# Patient Record
Sex: Male | Born: 1950 | Race: White | Hispanic: No | Smoking: Never smoker
Health system: Southern US, Community
[De-identification: ages and names within clinical notes are randomized; demographics above are authoritative.]

## PROBLEM LIST (undated history)

## (undated) DIAGNOSIS — R112 Nausea with vomiting, unspecified: Secondary | ICD-10-CM

## (undated) DIAGNOSIS — I10 Essential (primary) hypertension: Secondary | ICD-10-CM

## (undated) DIAGNOSIS — K219 Gastro-esophageal reflux disease without esophagitis: Secondary | ICD-10-CM

## (undated) DIAGNOSIS — E7800 Pure hypercholesterolemia, unspecified: Secondary | ICD-10-CM

## (undated) DIAGNOSIS — Z9889 Other specified postprocedural states: Secondary | ICD-10-CM

## (undated) HISTORY — PX: APPENDECTOMY: SHX54

## (undated) HISTORY — PX: COLON SURGERY: SHX602

## (undated) HISTORY — PX: CHOLECYSTECTOMY: SHX55

## (undated) HISTORY — PX: BACK SURGERY: SHX140

## (undated) HISTORY — PX: NECK SURGERY: SHX720

## (undated) HISTORY — DX: Essential (primary) hypertension: I10

## (undated) HISTORY — PX: SPINE SURGERY: SHX786

## (undated) HISTORY — DX: Pure hypercholesterolemia, unspecified: E78.00

## (undated) HISTORY — PX: OTHER SURGICAL HISTORY: SHX169

## (undated) HISTORY — DX: Gastro-esophageal reflux disease without esophagitis: K21.9

---

## 2015-06-21 ENCOUNTER — Other Ambulatory Visit: Payer: Self-pay | Admitting: Neurosurgery

## 2015-06-21 DIAGNOSIS — M5416 Radiculopathy, lumbar region: Secondary | ICD-10-CM

## 2015-07-06 ENCOUNTER — Ambulatory Visit
Admission: RE | Admit: 2015-07-06 | Discharge: 2015-07-06 | Disposition: A | Discharge status: Home / Self Care | Payer: 59 | Source: Ambulatory Visit | Attending: Neurosurgery | Admitting: Neurosurgery

## 2015-07-06 DIAGNOSIS — M5416 Radiculopathy, lumbar region: Secondary | ICD-10-CM

## 2015-07-06 MED ORDER — GADOBENATE DIMEGLUMINE 529 MG/ML IV SOLN
20.0000 mL | Freq: Once | INTRAVENOUS | Status: AC | PRN
  Administered 2015-07-06: 20 mL via INTRAVENOUS

## 2015-07-15 ENCOUNTER — Other Ambulatory Visit: Payer: Self-pay | Admitting: Neurosurgery

## 2015-07-15 ENCOUNTER — Other Ambulatory Visit (HOSPITAL_COMMUNITY): Payer: Self-pay | Admitting: Neurosurgery

## 2015-07-15 DIAGNOSIS — M5416 Radiculopathy, lumbar region: Secondary | ICD-10-CM

## 2015-07-18 ENCOUNTER — Encounter (HOSPITAL_COMMUNITY): Payer: Self-pay

## 2015-07-18 ENCOUNTER — Ambulatory Visit (HOSPITAL_COMMUNITY)
Admission: RE | Admit: 2015-07-18 | Discharge: 2015-07-18 | Disposition: A | Discharge status: Home / Self Care | Payer: 59 | Source: Ambulatory Visit | Attending: Neurosurgery | Admitting: Neurosurgery

## 2015-07-18 DIAGNOSIS — Z981 Arthrodesis status: Secondary | ICD-10-CM | POA: Insufficient documentation

## 2015-07-18 DIAGNOSIS — M4806 Spinal stenosis, lumbar region: Secondary | ICD-10-CM | POA: Diagnosis not present

## 2015-07-18 DIAGNOSIS — M4726 Other spondylosis with radiculopathy, lumbar region: Secondary | ICD-10-CM | POA: Insufficient documentation

## 2015-07-18 DIAGNOSIS — M5416 Radiculopathy, lumbar region: Secondary | ICD-10-CM

## 2015-07-18 MED ORDER — ONDANSETRON HCL 4 MG/2ML IJ SOLN: 4.0000 mg | Freq: Four times a day (QID) | INTRAMUSCULAR | Status: DC | PRN

## 2015-07-18 MED ORDER — DIAZEPAM 5 MG PO TABS
ORAL_TABLET | ORAL | Status: AC
  Filled 2015-07-18: qty 2

## 2015-07-18 MED ORDER — LIDOCAINE HCL (PF) 1 % IJ SOLN
INTRAMUSCULAR | Status: AC
  Filled 2015-07-18: qty 5

## 2015-07-18 MED ORDER — DIAZEPAM 5 MG PO TABS
10.0000 mg | ORAL_TABLET | Freq: Once | ORAL | Status: AC
Start: 2015-07-18 — End: 2015-07-18
  Administered 2015-07-18: 10 mg via ORAL
  Filled 2015-07-18: qty 2

## 2015-07-18 MED ORDER — IOHEXOL 180 MG/ML  SOLN
20.0000 mL | Freq: Once | INTRAMUSCULAR | Status: AC | PRN
  Administered 2015-07-18: 16 mL via INTRATHECAL

## 2015-07-18 NOTE — Procedures (Signed)
LP L 23 level with good return of CSF.  15 cc Omnipaque 180 instilled with good opacification of thecal sac.

## 2015-07-18 NOTE — Discharge Instructions (Signed)

## 2015-11-28 DIAGNOSIS — K219 Gastro-esophageal reflux disease without esophagitis: Secondary | ICD-10-CM | POA: Insufficient documentation

## 2015-11-28 DIAGNOSIS — G43909 Migraine, unspecified, not intractable, without status migrainosus: Secondary | ICD-10-CM | POA: Insufficient documentation

## 2015-11-28 DIAGNOSIS — G4730 Sleep apnea, unspecified: Secondary | ICD-10-CM | POA: Insufficient documentation

## 2015-11-28 DIAGNOSIS — G8929 Other chronic pain: Secondary | ICD-10-CM | POA: Insufficient documentation

## 2015-11-28 DIAGNOSIS — K5790 Diverticulosis of intestine, part unspecified, without perforation or abscess without bleeding: Secondary | ICD-10-CM | POA: Insufficient documentation

## 2016-01-03 ENCOUNTER — Encounter (INDEPENDENT_AMBULATORY_CARE_PROVIDER_SITE_OTHER): Payer: Self-pay | Admitting: *Deleted

## 2016-01-15 ENCOUNTER — Encounter (INDEPENDENT_AMBULATORY_CARE_PROVIDER_SITE_OTHER): Payer: Self-pay

## 2016-01-15 ENCOUNTER — Encounter (INDEPENDENT_AMBULATORY_CARE_PROVIDER_SITE_OTHER): Payer: Self-pay | Admitting: *Deleted

## 2016-01-15 ENCOUNTER — Ambulatory Visit (INDEPENDENT_AMBULATORY_CARE_PROVIDER_SITE_OTHER): Payer: 59 | Admitting: Internal Medicine

## 2016-01-15 ENCOUNTER — Other Ambulatory Visit (INDEPENDENT_AMBULATORY_CARE_PROVIDER_SITE_OTHER): Payer: Self-pay | Admitting: *Deleted

## 2016-01-15 ENCOUNTER — Other Ambulatory Visit (INDEPENDENT_AMBULATORY_CARE_PROVIDER_SITE_OTHER): Payer: Self-pay | Admitting: Internal Medicine

## 2016-01-15 ENCOUNTER — Encounter (INDEPENDENT_AMBULATORY_CARE_PROVIDER_SITE_OTHER): Payer: Self-pay | Admitting: Internal Medicine

## 2016-01-15 DIAGNOSIS — R195 Other fecal abnormalities: Secondary | ICD-10-CM

## 2016-01-15 DIAGNOSIS — Z800 Family history of malignant neoplasm of digestive organs: Secondary | ICD-10-CM

## 2016-01-15 DIAGNOSIS — R1032 Left lower quadrant pain: Secondary | ICD-10-CM

## 2016-01-15 DIAGNOSIS — I10 Essential (primary) hypertension: Secondary | ICD-10-CM | POA: Insufficient documentation

## 2016-01-15 DIAGNOSIS — E7800 Pure hypercholesterolemia, unspecified: Secondary | ICD-10-CM | POA: Insufficient documentation

## 2016-01-15 LAB — CBC WITH DIFFERENTIAL/PLATELET
BASOS ABS: 0 {cells}/uL (ref 0–200)
Basophils Relative: 0 %
EOS PCT: 3 %
Eosinophils Absolute: 204 cells/uL (ref 15–500)
HCT: 47.8 % (ref 38.5–50.0)
HEMOGLOBIN: 16.1 g/dL (ref 13.2–17.1)
LYMPHS ABS: 1836 {cells}/uL (ref 850–3900)
LYMPHS PCT: 27 %
MCH: 29.2 pg (ref 27.0–33.0)
MCHC: 33.7 g/dL (ref 32.0–36.0)
MCV: 86.6 fL (ref 80.0–100.0)
MONOS PCT: 9 %
MPV: 9.7 fL (ref 7.5–12.5)
Monocytes Absolute: 612 cells/uL (ref 200–950)
NEUTROS PCT: 61 %
Neutro Abs: 4148 cells/uL (ref 1500–7800)
Platelets: 216 10*3/uL (ref 140–400)
RBC: 5.52 MIL/uL (ref 4.20–5.80)
RDW: 16.9 % — AB (ref 11.0–15.0)
WBC: 6.8 10*3/uL (ref 3.8–10.8)

## 2016-01-15 MED ORDER — ONDANSETRON HCL 4 MG PO TABS: 4.0000 mg | ORAL_TABLET | Freq: Three times a day (TID) | ORAL | Status: DC | PRN

## 2016-01-15 MED ORDER — PEG 3350-KCL-NA BICARB-NACL 420 G PO SOLR: 4000.0000 mL | Freq: Once | ORAL | Status: DC

## 2016-01-15 NOTE — Telephone Encounter (Signed)
Patient needs trilyte 

## 2016-01-15 NOTE — Patient Instructions (Signed)
The risks and benefits such as perforation, bleeding, and infection were reviewed with the patient and is agreeable. 

## 2016-01-15 NOTE — Progress Notes (Addendum)
   Subjective:    Patient ID: Alexander DingwallKeith Harwick, male    DOB: 04/26/1951, 65 y.o.   MRN: [id number]  HPI Referred by Dr. Jonelle SportsPomposini for  Left abdominal pain.   He says he is alternating between constipation and diarrhea. He usually has 1-2 stools a day. CT showed borderline infection per patient. He says he was placed on Cipro and Flagyl. Left lower quadrant pain which started 3 weeks ago and radiates around to his back.  He says the pain increases as the day goes by once he starts moving around. He also says he has lost 12 pounds in the past month.   Appetite is good.  No fever.  Family hx of colon cancer in father who was 3661.  His last colonoscopy was February of 2014 before he had a sigmoidectomy due to diverticulitis/perforation.  Patient would like to proceed with a colonoscopy.    Colonoscopy 2014 Dr. Kirby CriglerSheflett:  Some narrowing of the sigmoid colon probably from recent diverticulitis, but no actual stricture and no inflammatory process. No neoplasia, Normal anal mucosa.   Review of Systems Past Medical History  Diagnosis Date  . Hypertension   . High cholesterol     Past Surgical History  Procedure Laterality Date  . Back surgery    . Neck surgery    . Cholecystectomy      abdominal pain  . Sigmoid colon surgery      in 2014    No Known Allergies  Current Outpatient Prescriptions on File Prior to Visit  Medication Sig Dispense Refill  . aspirin 81 MG tablet Take 81 mg by mouth daily.    Marland Kitchen. labetalol (NORMODYNE) 200 MG tablet Take 400 mg by mouth 2 (two) times daily.    . rosuvastatin (CRESTOR) 5 MG tablet Take 2.5 mg by mouth every other day.    . Vitamin D, Cholecalciferol, 1000 UNITS TABS Take 1,000 Units by mouth daily.     No current facility-administered medications on file prior to visit.        Objective:   Physical Exam Blood pressure 130/90, pulse 64, temperature 97.6 F (36.4 C), height 5\' 11"  (1.803 m), weight 213 lb 8 oz (96.843 kg). Alert and oriented.  Skin warm and dry. Oral mucosa is moist.   . Sclera anicteric, conjunctivae is pink. Thyroid not enlarged. No cervical lymphadenopathy. Lungs clear. Heart regular rate and rhythm.  Abdomen is soft. Bowel sounds are positive. No hepatomegaly. No abdominal masses felt. Slight tenderness left lower abdomen on deep palpitation.   No edema to lower extremities.       Assessment & Plan:  Left lower abdominal pain? Etiology. Doubt this is diverticulitis. Will get a CBC, Urinalysis.  Colonoscopy. The risks and benefits such as perforation, bleeding, and infection were reviewed with the patient and is agreeable.

## 2016-01-16 LAB — URINALYSIS
BILIRUBIN URINE: NEGATIVE
Glucose, UA: NEGATIVE
Hgb urine dipstick: NEGATIVE
KETONES UR: NEGATIVE
Leukocytes, UA: NEGATIVE
Nitrite: NEGATIVE
Protein, ur: NEGATIVE
SPECIFIC GRAVITY, URINE: 1.018 (ref 1.001–1.035)
pH: 5.5 (ref 5.0–8.0)

## 2016-01-24 ENCOUNTER — Encounter (HOSPITAL_COMMUNITY)
Admission: RE | Disposition: A | Discharge status: Home / Self Care | Payer: Self-pay | Source: Ambulatory Visit | Attending: Internal Medicine

## 2016-01-24 ENCOUNTER — Ambulatory Visit (HOSPITAL_COMMUNITY)
Admission: RE | Admit: 2016-01-24 | Discharge: 2016-01-24 | Disposition: A | Discharge status: Home / Self Care | Payer: 59 | Source: Ambulatory Visit | Attending: Internal Medicine | Admitting: Internal Medicine

## 2016-01-24 ENCOUNTER — Encounter (HOSPITAL_COMMUNITY): Payer: Self-pay | Admitting: *Deleted

## 2016-01-24 DIAGNOSIS — I10 Essential (primary) hypertension: Secondary | ICD-10-CM | POA: Diagnosis not present

## 2016-01-24 DIAGNOSIS — R1032 Left lower quadrant pain: Secondary | ICD-10-CM | POA: Diagnosis not present

## 2016-01-24 DIAGNOSIS — Z800 Family history of malignant neoplasm of digestive organs: Secondary | ICD-10-CM | POA: Insufficient documentation

## 2016-01-24 DIAGNOSIS — E7800 Pure hypercholesterolemia, unspecified: Secondary | ICD-10-CM | POA: Insufficient documentation

## 2016-01-24 DIAGNOSIS — Z79899 Other long term (current) drug therapy: Secondary | ICD-10-CM | POA: Insufficient documentation

## 2016-01-24 DIAGNOSIS — K5730 Diverticulosis of large intestine without perforation or abscess without bleeding: Secondary | ICD-10-CM | POA: Insufficient documentation

## 2016-01-24 DIAGNOSIS — K648 Other hemorrhoids: Secondary | ICD-10-CM | POA: Diagnosis not present

## 2016-01-24 DIAGNOSIS — R194 Change in bowel habit: Secondary | ICD-10-CM

## 2016-01-24 DIAGNOSIS — Z980 Intestinal bypass and anastomosis status: Secondary | ICD-10-CM | POA: Diagnosis not present

## 2016-01-24 DIAGNOSIS — Z7982 Long term (current) use of aspirin: Secondary | ICD-10-CM | POA: Diagnosis not present

## 2016-01-24 DIAGNOSIS — R195 Other fecal abnormalities: Secondary | ICD-10-CM

## 2016-01-24 HISTORY — PX: COLONOSCOPY: SHX5424

## 2016-01-24 SURGERY — COLONOSCOPY
Anesthesia: Moderate Sedation

## 2016-01-24 MED ORDER — MEPERIDINE HCL 50 MG/ML IJ SOLN
INTRAMUSCULAR | Status: DC | PRN
  Administered 2016-01-24 (×2): 25 mg via INTRAVENOUS

## 2016-01-24 MED ORDER — MIDAZOLAM HCL 5 MG/5ML IJ SOLN
INTRAMUSCULAR | Status: DC | PRN
  Administered 2016-01-24 (×2): 2 mg via INTRAVENOUS
  Administered 2016-01-24: 1 mg via INTRAVENOUS
  Administered 2016-01-24: 2 mg via INTRAVENOUS
  Administered 2016-01-24: 3 mg via INTRAVENOUS

## 2016-01-24 MED ORDER — SIMETHICONE 40 MG/0.6ML PO SUSP
ORAL | Status: AC
  Filled 2016-01-24: qty 30

## 2016-01-24 MED ORDER — STERILE WATER FOR IRRIGATION IR SOLN
Status: DC | PRN
  Administered 2016-01-24: 08:00:00

## 2016-01-24 MED ORDER — SODIUM CHLORIDE 0.9 % IV SOLN
INTRAVENOUS | Status: DC
  Administered 2016-01-24: 1000 mL via INTRAVENOUS

## 2016-01-24 MED ORDER — DICYCLOMINE HCL 10 MG PO CAPS: 10.0000 mg | ORAL_CAPSULE | Freq: Two times a day (BID) | ORAL | Status: DC | PRN

## 2016-01-24 MED ORDER — MEPERIDINE HCL 50 MG/ML IJ SOLN
INTRAMUSCULAR | Status: AC
  Filled 2016-01-24: qty 1

## 2016-01-24 MED ORDER — MIDAZOLAM HCL 5 MG/5ML IJ SOLN
INTRAMUSCULAR | Status: AC
  Filled 2016-01-24: qty 10

## 2016-01-24 NOTE — Op Note (Signed)
[facility] Patient Name: Alexander Holycross Procedure Date: 01/24/2016 7:26 AM MRN: [id number] Date of Birth: 09-14-1950 Attending MD: Lionel December , MD CSN: [id number] Age: 65 Admit Type: Outpatient Procedure:                Colonoscopy Indications:              Abdominal pain in the left lower quadrant, Change                            in bowel habits Providers:                Lionel December, MD, Edrick Kins, RN, Gearldine Shown,                            Technologist Referring MD:             Rande Brunt. Pomposini, MD Medicines:                Meperidine 50 mg IV, Midazolam 10 mg IV Complications:            No immediate complications. Estimated Blood Loss:     Estimated blood loss: none. Procedure:                Pre-Anesthesia Assessment:                           - Prior to the procedure, a History and Physical                            was performed, and patient medications and                            allergies were reviewed. The patient's tolerance of                            previous anesthesia was also reviewed. The risks                            and benefits of the procedure and the sedation                            options and risks were discussed with the patient.                            All questions were answered, and informed consent                            was obtained. Prior Anticoagulants: The patient                            last took aspirin 4 days prior to the procedure.                            ASA Grade Assessment: II - A patient with mild  systemic disease. After reviewing the risks and                            benefits, the patient was deemed in satisfactory                            condition to undergo the procedure.                           After obtaining informed consent, the colonoscope                            was passed under direct vision. Throughout the                            procedure, the  patient's blood pressure, pulse, and                            oxygen saturations were monitored continuously. The                            EC-[id number] ([id number]) scope was introduced through                            the anus and advanced to the the terminal ileum.                            The colonoscopy was performed without difficulty.                            The patient tolerated the procedure well. The                            quality of the bowel preparation was excellent. The                            terminal ileum, ileocecal valve, appendiceal                            orifice, and rectum were photographed. Scope In: 7:57:41 AM Scope Out: 8:10:17 AM Total Procedure Duration: 0 hours 12 minutes 36 seconds  Findings:      The terminal ileum appeared normal.      Scattered medium-mouthed diverticula were found in the descending colon,       transverse colon, hepatic flexure and ascending colon.      There was evidence of a prior end-to-end colo-colonic anastomosis in the       distal sigmoid colon. This was patent and was characterized by healthy       appearing mucosa. The anastomosis was traversed.      Internal hemorrhoids were found during retroflexion. The hemorrhoids       were small. Impression:               - The examined portion of the ileum was normal.                           -  Diverticulosis in the descending colon, in the                            transverse colon, at the hepatic flexure and in the                            ascending colon.                           - Patent end-to-end colo-colonic anastomosis,                            characterized by healthy appearing mucosa.                           - Internal hemorrhoids.                           - No specimens collected. Moderate Sedation:      Moderate (conscious) sedation was administered by the endoscopy nurse       and supervised by the endoscopist. The following parameters were        monitored: oxygen saturation, heart rate, blood pressure, CO2       capnography and response to care. Total physician intraservice time was       22 minutes. Recommendation:           - Patient has a contact number available for                            emergencies. The signs and symptoms of potential                            delayed complications were discussed with the                            patient. Return to normal activities tomorrow.                            Written discharge instructions were provided to the                            patient.                           - High fiber diet today.                           - Continue present medications.                           - Resume aspirin at prior dose today.                           - Repeat colonoscopy in 5 years for surveillance.                           -  Return to my office in 3 months.                           - Use Bentyl (dicyclomine) 10 mg PO twice daily as                            needed. Procedure Code(s):        --- Professional ---                           [phone], Colonoscopy, flexible; diagnostic, including                            collection of specimen(s) by brushing or washing,                            when performed (separate procedure)                           99152, Moderate sedation services provided by the                            same physician or other qualified health care                            professional performing the diagnostic or                            therapeutic service that the sedation supports,                            requiring the presence of an independent trained                            observer to assist in the monitoring of the                            patient's level of consciousness and physiological                            status; initial 15 minutes of intraservice time,                            patient age 44 years or older Diagnosis  Code(s):        --- Professional ---                           K64.8, Other hemorrhoids                           Z98.0, Intestinal bypass and anastomosis status                           R10.32, Left lower quadrant pain  R19.4, Change in bowel habit                           K57.30, Diverticulosis of large intestine without                            perforation or abscess without bleeding CPT copyright 2016 American Medical Association. All rights reserved. The codes documented in this report are preliminary and upon coder review may  be revised to meet current compliance requirements. Lionel December, MD Lionel December, MD 01/24/2016 8:24:51 AM This report has been signed electronically. Number of Addenda: 0

## 2016-01-24 NOTE — H&P (Signed)
Alexander Shepherd is an 65 y.o. male.   Chief Complaint: Patient is here for colonoscopy. HPI: Patient is 65 year old Caucasian male who has history of diverticulitis status post sigmoid resection in 2014 presented over 5 weeks ago with left-sided pain. He was treated with Cipro and Flagyl but his symptoms have never resolved. Has aching pain. He's had intermittent diarrhea. He denies melena or rectal bleeding. He doesn't use 12 pounds during his sickness. His weight loss has leveled off. Family history significant for CRC and father who was 1660 at the time of diagnosis and died at age 63  Past Medical History  Diagnosis Date  . Hypertension   . High cholesterol     Past Surgical History  Procedure Laterality Date  . Back surgery    . Neck surgery    . Cholecystectomy      abdominal pain  . Sigmoid colon surgery      in 2014    Family History  Problem Relation Age of Onset  . Cancer Father    Social History:  reports that he has never smoked. He does not have any smokeless tobacco history on file. He reports that he drinks alcohol. He reports that he does not use illicit drugs.  Allergies: No Known Allergies  Medications Prior to Admission  Medication Sig Dispense Refill  . aspirin 81 MG tablet Take 81 mg by mouth daily.    Marland Kitchen. labetalol (NORMODYNE) 200 MG tablet Take 400 mg by mouth 2 (two) times daily.    . lansoprazole (PREVACID) 15 MG capsule Take 15 mg by mouth daily at 12 noon.    . polyethylene glycol-electrolytes (TRILYTE) 420 g solution Take 4,000 mLs by mouth once. 4000 mL 0  . rosuvastatin (CRESTOR) 5 MG tablet Take 2.5 mg by mouth every other day.    . Vitamin D, Cholecalciferol, 1000 UNITS TABS Take 1,000 Units by mouth daily.    . ondansetron (ZOFRAN) 4 MG tablet Take 1 tablet (4 mg total) by mouth every 8 (eight) hours as needed for nausea or vomiting. 60 tablet 0  . triamcinolone (NASACORT ALLERGY 24HR) 55 MCG/ACT AERO nasal inhaler Place 2 sprays into the nose daily.       No results found for this or any previous visit (from the past 48 hour(s)). No results found.  ROS  Blood pressure 132/96, pulse 80, temperature 98.5 F (36.9 C), temperature source Oral, resp. rate 16. Physical Exam  Constitutional: He appears well-developed and well-nourished.  HENT:  Mouth/Throat: Oropharynx is clear and moist.  Eyes: Conjunctivae are normal. No scleral icterus.  Neck: No thyromegaly present.  Cardiovascular: Normal rate, regular rhythm and normal heart sounds.   No murmur heard. Respiratory: Effort normal and breath sounds normal.  GI: Soft. He exhibits no distension and no mass. There is no tenderness.  Musculoskeletal: He exhibits no edema.  Lymphadenopathy:    He has no cervical adenopathy.  Neurological: He is alert.  Skin: Skin is warm and dry.     Assessment/Plan Persistent left low quadrant abdominal pain. History of diverticulitis. Family history of CRC in first-degree relative. Diagnostic colonoscopy.  Lionel DecemberNajeeb Danniel Grenz, MD 01/24/2016, 7:43 AM

## 2016-01-24 NOTE — Discharge Instructions (Signed)
Resume usual medications and high fiber diet. Dicyclomine 10 mg by mouth daily before breakfast and second dose on as-needed basis(for lunch or evening meal). No driving for 24 hours. Office visit in 3 months.  Colonoscopy, Care After Refer to this sheet in the next few weeks. These instructions provide you with information on caring for yourself after your procedure. Your health care provider may also give you more specific instructions. Your treatment has been planned according to current medical practices, but problems sometimes occur. Call your health care provider if you have any problems or questions after your procedure.  Dr Karilyn Cota [phone]  WHAT TO EXPECT AFTER THE PROCEDURE  After your procedure, it is typical to have the following:  A small amount of blood in your stool.  Moderate amounts of gas and mild abdominal cramping or bloating. HOME CARE INSTRUCTIONS  Do not drive, operate machinery, or sign important documents for 24 hours.  You may shower and resume your regular physical activities, but move at a slower pace for the first 24 hours.  Take frequent rest periods for the first 24 hours.  Walk around or put a warm pack on your abdomen to help reduce abdominal cramping and bloating.  Drink enough fluids to keep your urine clear or pale yellow.  You may resume your normal diet as instructed by your health care provider. Avoid heavy or fried foods that are hard to digest.  Avoid drinking alcohol for 24 hours or as instructed by your health care provider.  Only take over-the-counter or prescription medicines as directed by your health care provider. SEEK MEDICAL CARE IF: You have persistent spotting of blood in your stool 2-3 days after the procedure. SEEK IMMEDIATE MEDICAL CARE IF:  You have more than a small spotting of blood in your stool.  You pass large blood clots in your stool.  Your abdomen is swollen (distended).  You have nausea or vomiting.  You  have a fever.  You have increasing abdominal pain that is not relieved with medicine.   This information is not intended to replace advice given to you by your health care provider. Make sure you discuss any questions you have with your health care provider.   Document Released: 03/17/2004 Document Revised: 05/24/2013 Document Reviewed: 04/10/2013 Elsevier Interactive Patient Education 2016 [employer].  Diverticulosis Diverticulosis is the condition that develops when small pouches (diverticula) form in the wall of your colon. Your colon, or large intestine, is where water is absorbed and stool is formed. The pouches form when the inside layer of your colon pushes through weak spots in the outer layers of your colon. CAUSES  No one knows exactly what causes diverticulosis. RISK FACTORS  Being older than 50. Your risk for this condition increases with age. Diverticulosis is rare in people younger than 40 years. By age 105, almost everyone has it.  Eating a low-fiber diet.  Being frequently constipated.  Being overweight.  Not getting enough exercise.  Smoking.  Taking over-the-counter pain medicines, like aspirin and ibuprofen. SYMPTOMS  Most people with diverticulosis do not have symptoms. DIAGNOSIS  Because diverticulosis often has no symptoms, health care providers often discover the condition during an exam for other colon problems. In many cases, a health care provider will diagnose diverticulosis while using a flexible scope to examine the colon (colonoscopy). TREATMENT  If you have never developed an infection related to diverticulosis, you may not need treatment. If you have had an infection before, treatment may include:  Eating  more fruits, vegetables, and grains.  Taking a fiber supplement.  Taking a live bacteria supplement (probiotic).  Taking medicine to relax your colon. HOME CARE INSTRUCTIONS   Drink at least 6-8 glasses of water each day to prevent  constipation.  Try not to strain when you have a bowel movement.  Keep all follow-up appointments. If you have had an infection before:  Increase the fiber in your diet as directed by your health care provider or dietitian.  Take a dietary fiber supplement if your health care provider approves.  Only take medicines as directed by your health care provider. SEEK MEDICAL CARE IF:   You have abdominal pain.  You have bloating.  You have cramps.  You have not gone to the bathroom in 3 days. SEEK IMMEDIATE MEDICAL CARE IF:   Your pain gets worse.  Yourbloating becomes very bad.  You have a fever or chills, and your symptoms suddenly get worse.  You begin vomiting.  You have bowel movements that are bloody or black. MAKE SURE YOU:  Understand these instructions.  Will watch your condition.  Will get help right away if you are not doing well or get worse.   This information is not intended to replace advice given to you by your health care provider. Make sure you discuss any questions you have with your health care provider.   Document Released: 04/30/2004 Document Revised: 08/08/2013 Document Reviewed: 06/28/2013 Elsevier Interactive Patient Education 2016 Elsevier Inc.   High-Fiber Diet Fiber, also called dietary fiber, is a type of carbohydrate found in fruits, vegetables, whole grains, and beans. A high-fiber diet can have many health benefits. Your health care provider may recommend a high-fiber diet to help:  Prevent constipation. Fiber can make your bowel movements more regular.  Lower your cholesterol.  Relieve hemorrhoids, uncomplicated diverticulosis, or irritable bowel syndrome.  Prevent overeating as part of a weight-loss plan.  Prevent heart disease, type 2 diabetes, and certain cancers. WHAT IS MY PLAN? The recommended daily intake of fiber includes:  38 grams for men under age 66.  30 grams for men over age 56.  25 grams for women under age  44.  21 grams for women over age 11. You can get the recommended daily intake of dietary fiber by eating a variety of fruits, vegetables, grains, and beans. Your health care provider may also recommend a fiber supplement if it is not possible to get enough fiber through your diet. WHAT DO I NEED TO KNOW ABOUT A HIGH-FIBER DIET?  Fiber supplements have not been widely studied for their effectiveness, so it is better to get fiber through food sources.  Always check the fiber content on thenutrition facts label of any prepackaged food. Look for foods that contain at least 5 grams of fiber per serving.  Ask your dietitian if you have questions about specific foods that are related to your condition, especially if those foods are not listed in the following section.  Increase your daily fiber consumption gradually. Increasing your intake of dietary fiber too quickly may cause bloating, cramping, or gas.  Drink plenty of water. Water helps you to digest fiber. WHAT FOODS CAN I EAT? Grains Whole-grain breads. Multigrain cereal. Oats and oatmeal. Brown rice. Barley. Bulgur wheat. Millet. Bran muffins. Popcorn. Rye wafer crackers. Vegetables Sweet potatoes. Spinach. Kale. Artichokes. Cabbage. Broccoli. Green peas. Carrots. Squash. Fruits Berries. Pears. Apples. Oranges. Avocados. Prunes and raisins. Dried figs. Meats and Other Protein Sources Navy, kidney, pinto, and soy beans. Split  peas. Lentils. Nuts and seeds. Dairy Fiber-fortified yogurt. Beverages Fiber-fortified soy milk. Fiber-fortified orange juice. Other Fiber bars. The items listed above may not be a complete list of recommended foods or beverages. Contact your dietitian for more options. WHAT FOODS ARE NOT RECOMMENDED? Grains White bread. Pasta made with refined flour. White rice. Vegetables Fried potatoes. Canned vegetables. Well-cooked vegetables.  Fruits Fruit juice. Cooked, strained fruit. Meats and Other Protein  Sources Fatty cuts of meat. Fried Environmental education officerpoultry or fried fish. Dairy Milk. Yogurt. Cream cheese. Sour cream. Beverages Soft drinks. Other Cakes and pastries. Butter and oils. The items listed above may not be a complete list of foods and beverages to avoid. Contact your dietitian for more information. WHAT ARE SOME TIPS FOR INCLUDING HIGH-FIBER FOODS IN MY DIET?  Eat a wide variety of high-fiber foods.  Make sure that half of all grains consumed each day are whole grains.  Replace breads and cereals made from refined flour or white flour with whole-grain breads and cereals.  Replace white rice with brown rice, bulgur wheat, or millet.  Start the day with a breakfast that is high in fiber, such as a cereal that contains at least 5 grams of fiber per serving.  Use beans in place of meat in soups, salads, or pasta.  Eat high-fiber snacks, such as berries, raw vegetables, nuts, or popcorn.   This information is not intended to replace advice given to you by your health care provider. Make sure you discuss any questions you have with your health care provider.   Document Released: 08/03/2005 Document Revised: 08/24/2014 Document Reviewed: 01/16/2014 Elsevier Interactive Patient Education [employer] Elsevier Inc.

## 2016-01-27 ENCOUNTER — Ambulatory Visit (INDEPENDENT_AMBULATORY_CARE_PROVIDER_SITE_OTHER): Payer: 59 | Admitting: Internal Medicine

## 2016-01-28 ENCOUNTER — Encounter (HOSPITAL_COMMUNITY): Payer: Self-pay | Admitting: Internal Medicine

## 2016-01-29 ENCOUNTER — Encounter (INDEPENDENT_AMBULATORY_CARE_PROVIDER_SITE_OTHER): Payer: Self-pay

## 2016-02-05 ENCOUNTER — Telehealth (INDEPENDENT_AMBULATORY_CARE_PROVIDER_SITE_OTHER): Payer: Self-pay | Admitting: Internal Medicine

## 2016-02-05 NOTE — Telephone Encounter (Signed)
This will be addressed with Dr.Rehman. 

## 2016-02-05 NOTE — Telephone Encounter (Signed)
Patient's wife Dois DavenportSandra called and stated that the patient isn't any better, still having pain.  He is taking Bentyl morning and night and it's not helping.  She brought his CT report when he was here last, have not heard anything about that.  She wants to know what to do, where to go from here, does he need to refer him to another doctor?  [phone]

## 2016-02-06 NOTE — Telephone Encounter (Signed)
Patient was called and message was left. She was advised that Dr. Karilyn Cotaehman would review CT again and call with his recommendation.

## 2016-02-11 ENCOUNTER — Encounter (INDEPENDENT_AMBULATORY_CARE_PROVIDER_SITE_OTHER): Payer: Self-pay | Admitting: Internal Medicine

## 2016-02-11 ENCOUNTER — Ambulatory Visit (INDEPENDENT_AMBULATORY_CARE_PROVIDER_SITE_OTHER): Payer: 59 | Admitting: Internal Medicine

## 2016-02-11 ENCOUNTER — Encounter (INDEPENDENT_AMBULATORY_CARE_PROVIDER_SITE_OTHER): Payer: Self-pay | Admitting: *Deleted

## 2016-02-11 VITALS — BP 150/90 | HR 64 | Temp 98.0°F | Ht 71.0 in | Wt 216.0 lb

## 2016-02-11 DIAGNOSIS — K5732 Diverticulitis of large intestine without perforation or abscess without bleeding: Secondary | ICD-10-CM | POA: Diagnosis not present

## 2016-02-11 MED ORDER — CIPROFLOXACIN HCL 500 MG PO TABS: 500.0000 mg | ORAL_TABLET | Freq: Two times a day (BID) | ORAL | Status: DC

## 2016-02-11 MED ORDER — HYDROCODONE-ACETAMINOPHEN 5-325 MG PO TABS: 1.0000 | ORAL_TABLET | Freq: Four times a day (QID) | ORAL | Status: DC | PRN

## 2016-02-11 MED ORDER — METRONIDAZOLE 500 MG PO TABS: 500.0000 mg | ORAL_TABLET | Freq: Two times a day (BID) | ORAL | Status: DC

## 2016-02-11 NOTE — Progress Notes (Addendum)
   Subjective:    Patient ID: Alexander HallerKeith G Koslow, male    DOB: 1950-09-09, 65 y.o.   MRN: [id number]  HPI Presents today with c/o LLQ pain. Hx of diverticulitis per patient. Underwent a sigmoidectomy in ? 2014. He has had the pain for almost 2 months in LLQ and flank.  He is sore to the touch around his kidney. Recently underwent a colonoscopy this month for same which was normal.  He is having a BM daily which he describes as small. He leans toward constipation. No melena or BRRB.  Recently underwent a colonoscopy for LLQ pain.  Appetite is okay. No weight loss. He walks every day and tries to eat healthy. He has increased fiber in his diet.   Family hx of colon cancer in father who was 4161.   01/24/2016 Colonoscopy: Dr. Karilyn Cotaehman   Indications: Abdominal pain in the left lower quadrant,     . Impression: - The examined portion of the ileum was normal.  - Diverticulosis in the descending colon, in the   transverse colon, at the hepatic flexure and in the   ascending colon.  - Patent end-to-end colo-colonic anastomosis  [id number] CT abdomen/pelvis with CM: abdominal pain: No acute abdominal process.   Hx of  Sigmoidectomy  due to diverticulitis/perforation 2014.     Colonoscopy 2014 Dr. Kirby CriglerSheflett: Some narrowing of the sigmoid colon probably from recent diverticulitis, but no actual stricture and no inflammatory process. No neoplasia, Normal anal mucosa.   ,   Review of Systems     Objective:   Physical ExamBlood pressure 150/90, pulse 64, temperature 98 F (36.7 C), height 5\' 11"  (1.803 m), weight 216 lb (97.977 kg). Alert and oriented. Skin warm and dry. Oral mucosa is moist.   . Sclera anicteric, conjunctivae is pink. Thyroid not enlarged. No cervical lymphadenopathy. Lungs clear. Heart regular rate and rhythm.  Abdomen is soft. Bowel sounds are  positive. No hepatomegaly. No abdominal masses felt.  Tenderness left lower quadrant and left flank (over kidney).  No edema to lower extremities.          Assessment & Plan:  LLQ pain. Diverticulitis needs to be ruled out. CT abdomen/pelvis with CM. Rx for Cipro and Flagyl sent to his pharmacy. Rx for Norco 5/325 # 30 given to patient.  Labs: CBC and urinalysis.

## 2016-02-11 NOTE — Patient Instructions (Addendum)
CT abdomen/pelvis with CM. Labs Rx for Norco given to patient.

## 2016-02-12 LAB — URINALYSIS
Bilirubin Urine: NEGATIVE
Glucose, UA: NEGATIVE
HGB URINE DIPSTICK: NEGATIVE
KETONES UR: NEGATIVE
Leukocytes, UA: NEGATIVE
NITRITE: NEGATIVE
PH: 5.5 (ref 5.0–8.0)
PROTEIN: NEGATIVE
Specific Gravity, Urine: 1.014 (ref 1.001–1.035)

## 2016-02-12 LAB — CBC WITH DIFFERENTIAL/PLATELET
BASOS PCT: 1 %
Basophils Absolute: 99 cells/uL (ref 0–200)
EOS PCT: 2 %
Eosinophils Absolute: 198 cells/uL (ref 15–500)
HEMATOCRIT: 49.6 % (ref 38.5–50.0)
HEMOGLOBIN: 16.4 g/dL (ref 13.2–17.1)
LYMPHS ABS: 1980 {cells}/uL (ref 850–3900)
Lymphocytes Relative: 20 %
MCH: 28.8 pg (ref 27.0–33.0)
MCHC: 33.1 g/dL (ref 32.0–36.0)
MCV: 87 fL (ref 80.0–100.0)
MONO ABS: 1089 {cells}/uL — AB (ref 200–950)
MPV: 10 fL (ref 7.5–12.5)
Monocytes Relative: 11 %
NEUTROS ABS: 6534 {cells}/uL (ref 1500–7800)
NEUTROS PCT: 66 %
Platelets: 236 10*3/uL (ref 140–400)
RBC: 5.7 MIL/uL (ref 4.20–5.80)
RDW: 17.3 % — ABNORMAL HIGH (ref 11.0–15.0)
WBC: 9.9 10*3/uL (ref 3.8–10.8)

## 2016-02-13 ENCOUNTER — Ambulatory Visit (HOSPITAL_COMMUNITY)
Admission: RE | Admit: 2016-02-13 | Discharge: 2016-02-13 | Disposition: A | Discharge status: Home / Self Care | Payer: 59 | Source: Ambulatory Visit | Attending: Internal Medicine | Admitting: Internal Medicine

## 2016-02-13 DIAGNOSIS — K5732 Diverticulitis of large intestine without perforation or abscess without bleeding: Secondary | ICD-10-CM | POA: Diagnosis not present

## 2016-02-13 DIAGNOSIS — I700 Atherosclerosis of aorta: Secondary | ICD-10-CM | POA: Insufficient documentation

## 2016-02-13 LAB — POCT I-STAT CREATININE: Creatinine, Ser: 1.1 mg/dL (ref 0.61–1.24)

## 2016-02-13 MED ORDER — IOPAMIDOL (ISOVUE-300) INJECTION 61%
100.0000 mL | Freq: Once | INTRAVENOUS | Status: AC | PRN
  Administered 2016-02-13: 100 mL via INTRAVENOUS

## 2016-02-14 ENCOUNTER — Telehealth: Payer: Self-pay | Admitting: Gastroenterology

## 2016-02-14 NOTE — Telephone Encounter (Signed)
CALLED BY RADIOLOGY. PT HAS UNCOMPLICATED DIVERTICULITIS. RX FOR CIP/FLAGYL ALREADY GIVEN ON 6/27.

## 2016-02-17 NOTE — Telephone Encounter (Signed)
Dr.Field's note appreciated. Patient called and message left on his answering service to call us with progress report.. Patient needs office visit next week.

## 2016-02-19 ENCOUNTER — Telehealth (INDEPENDENT_AMBULATORY_CARE_PROVIDER_SITE_OTHER): Payer: Self-pay | Admitting: Internal Medicine

## 2016-02-19 DIAGNOSIS — K5732 Diverticulitis of large intestine without perforation or abscess without bleeding: Secondary | ICD-10-CM

## 2016-02-19 MED ORDER — METRONIDAZOLE 500 MG PO TABS: 500.0000 mg | ORAL_TABLET | Freq: Two times a day (BID) | ORAL | Status: DC

## 2016-02-19 MED ORDER — CIPROFLOXACIN HCL 500 MG PO TABS: 500.0000 mg | ORAL_TABLET | Freq: Two times a day (BID) | ORAL | Status: DC

## 2016-02-19 NOTE — Telephone Encounter (Signed)
I have sent a refill to his pharmacy for Cipro and Flagyl

## 2016-02-19 NOTE — Telephone Encounter (Signed)
Patient's spouse, Dois DavenportSandra called back and wants to know what the patient can eat and what he can not eat with this flare up.  She wasn't sure if there were certain foods he can or can not have.    [phone] (spouse's work #)

## 2016-02-19 NOTE — Telephone Encounter (Signed)
Tell them they can come by her and get a diverticular diet.

## 2016-02-19 NOTE — Telephone Encounter (Signed)
Rx for Cipro and Flagyl sent to his pharmacy.  

## 2016-02-19 NOTE — Telephone Encounter (Signed)
Tell them they can come by here and get a diverticular diet or you can have Tammy call them

## 2016-02-19 NOTE — Telephone Encounter (Signed)
Patient's spouse, Dois DavenportSandra called, stated the patient has diverticulitis.  Stated that the antibiotics are not helping and that he is trying to do a liquid diet to help to not cause any issues.  Stated that he is very depressed.  She wants to know if there is anything else that can be done and she'd like for you to call him.  [phone]

## 2016-02-19 NOTE — Telephone Encounter (Signed)
I called Mrs. Alexander Shepherd and got her fax #, I faxed her a copy of the Diverticular Diet to [phone]

## 2016-03-04 ENCOUNTER — Telehealth (INDEPENDENT_AMBULATORY_CARE_PROVIDER_SITE_OTHER): Payer: Self-pay | Admitting: Internal Medicine

## 2016-03-04 NOTE — Telephone Encounter (Signed)
Call returned  Talked with patient's wife. Ration remains with pain.. Continue antibiotic and will repeat CT in one week. Tammy, at least call Dr. Fredrich BirksStern's office(patient's neurosurgeon) if they could see him in the near future because some of his pain may be referred from back.

## 2016-03-04 NOTE — Telephone Encounter (Signed)
Forwarded to Dr.Rehman for review. 

## 2016-03-04 NOTE — Telephone Encounter (Signed)
Patient's spouse, Dois DavenportSandra called and stated that she would like to know what the next step for Mellody DanceKeith is.  Stated that he's on his third round of antibiotics and it's not helping.  She stated that Dr. Karilyn Cotaehman had mentioned another CT and/or a referral to Dr. Venetia MaxonStern for his back.  She would like a call back.  [phone] (home) [phone] (Faustino's cell) [phone] (Sandra's cell)

## 2016-03-05 ENCOUNTER — Other Ambulatory Visit (INDEPENDENT_AMBULATORY_CARE_PROVIDER_SITE_OTHER): Payer: Self-pay | Admitting: *Deleted

## 2016-03-05 ENCOUNTER — Other Ambulatory Visit (INDEPENDENT_AMBULATORY_CARE_PROVIDER_SITE_OTHER): Payer: Self-pay | Admitting: Internal Medicine

## 2016-03-05 DIAGNOSIS — K5732 Diverticulitis of large intestine without perforation or abscess without bleeding: Secondary | ICD-10-CM

## 2016-03-05 DIAGNOSIS — Z1211 Encounter for screening for malignant neoplasm of colon: Secondary | ICD-10-CM

## 2016-03-05 DIAGNOSIS — R1032 Left lower quadrant pain: Secondary | ICD-10-CM

## 2016-03-05 NOTE — Telephone Encounter (Signed)
Dr.Stern's office was called. Spoke with the operator ,then I was transferred to Dr.Stern's secretary,Hailey. I left detailed message and ask that she call me back,if needed. That we would like patient seen in the near future.

## 2016-03-05 NOTE — Telephone Encounter (Signed)
CT sch'd 03/16/16 at 10:00 (945), pick up contrast, patient's wife Dois DavenportSandra is aware

## 2016-03-16 ENCOUNTER — Ambulatory Visit (HOSPITAL_COMMUNITY)
Admission: RE | Admit: 2016-03-16 | Discharge: 2016-03-16 | Disposition: A | Discharge status: Home / Self Care | Payer: 59 | Source: Ambulatory Visit | Attending: Internal Medicine | Admitting: Internal Medicine

## 2016-03-16 DIAGNOSIS — I700 Atherosclerosis of aorta: Secondary | ICD-10-CM | POA: Diagnosis not present

## 2016-03-16 DIAGNOSIS — K5732 Diverticulitis of large intestine without perforation or abscess without bleeding: Secondary | ICD-10-CM | POA: Diagnosis not present

## 2016-03-16 DIAGNOSIS — R1032 Left lower quadrant pain: Secondary | ICD-10-CM | POA: Insufficient documentation

## 2016-03-16 DIAGNOSIS — R935 Abnormal findings on diagnostic imaging of other abdominal regions, including retroperitoneum: Secondary | ICD-10-CM | POA: Diagnosis not present

## 2016-03-16 MED ORDER — IOPAMIDOL (ISOVUE-300) INJECTION 61%
100.0000 mL | Freq: Once | INTRAVENOUS | Status: AC | PRN
  Administered 2016-03-16: 100 mL via INTRAVENOUS

## 2016-04-11 ENCOUNTER — Other Ambulatory Visit: Payer: Self-pay | Admitting: Neurosurgery

## 2016-04-11 DIAGNOSIS — M5414 Radiculopathy, thoracic region: Secondary | ICD-10-CM

## 2016-04-14 ENCOUNTER — Ambulatory Visit
Admission: RE | Admit: 2016-04-14 | Discharge: 2016-04-14 | Disposition: A | Discharge status: Home / Self Care | Payer: 59 | Source: Ambulatory Visit | Attending: Neurosurgery | Admitting: Neurosurgery

## 2016-04-14 DIAGNOSIS — M5414 Radiculopathy, thoracic region: Secondary | ICD-10-CM

## 2016-04-28 ENCOUNTER — Ambulatory Visit (INDEPENDENT_AMBULATORY_CARE_PROVIDER_SITE_OTHER): Payer: Medicare Other | Admitting: Internal Medicine

## 2016-06-24 ENCOUNTER — Encounter (HOSPITAL_COMMUNITY): Payer: Self-pay

## 2016-06-24 ENCOUNTER — Ambulatory Visit (HOSPITAL_COMMUNITY): Admit: 2016-06-24 | Payer: 59 | Admitting: Internal Medicine

## 2016-06-24 SURGERY — COLONOSCOPY
Anesthesia: Moderate Sedation

## 2017-01-19 IMAGING — CT CT ABD-PELV W/ CM
2 of 5 series · 16 of 46 positions shown, 18 images · IV contrast (iopamidol)
Comparison: None.

CLINICAL DATA: 64-year-old male with abdominal and pelvic pain for
2 weeks. History of diverticulosis.

EXAM:
CT ABDOMEN AND PELVIS WITH CONTRAST
TECHNIQUE: Multidetector CT imaging of the abdomen and pelvis was performed
using the standard protocol following bolus administration of
intravenous contrast.
CONTRAST:  100mL [id number] IOPAMIDOL ([id number]) INJECTION 61%

[Series 2: routine abd pel with · axial · 0.71mm/px · z∈[-417,+18]mm · 13 of 99 slices shown, 15 images]
[im 6/99  soft-tissue]
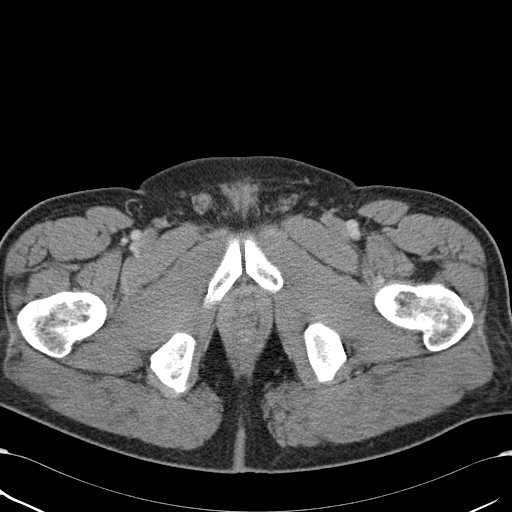
[im 6/99  bone]
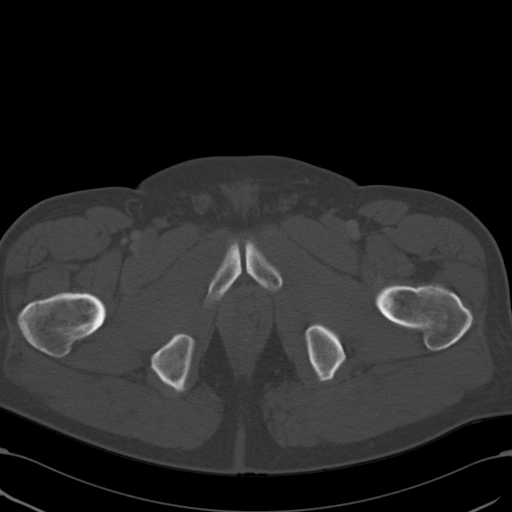
[im 12/99  soft-tissue]
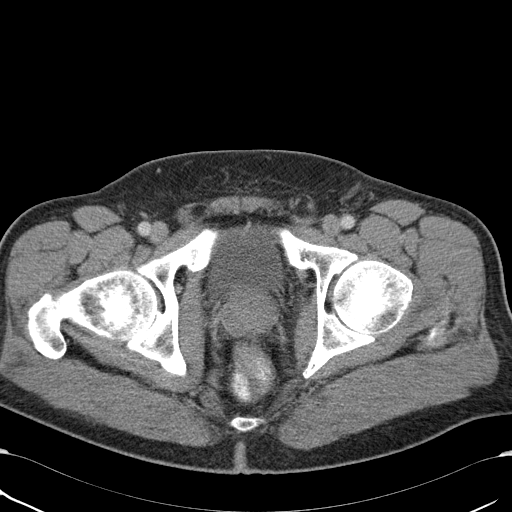
[im 24/99  soft-tissue]
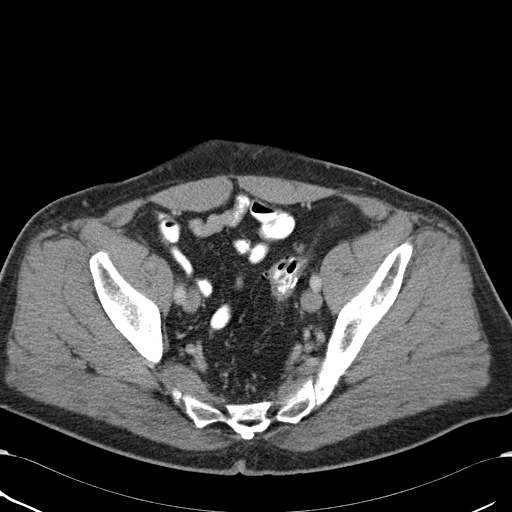
[im 29/99  soft-tissue]
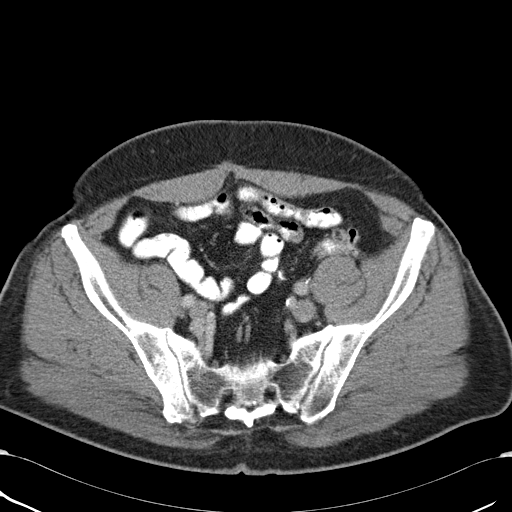
[im 35/99  soft-tissue]
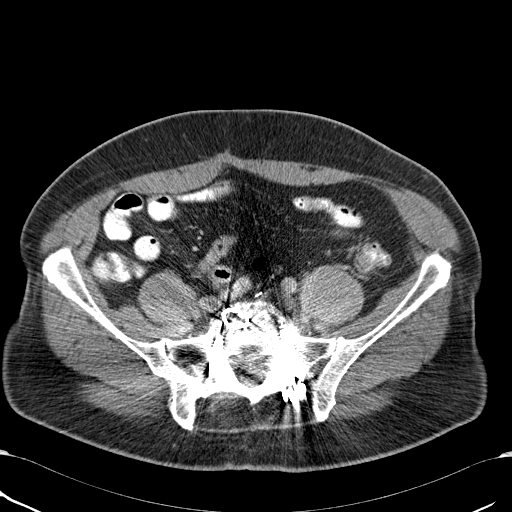
[im 41/99  soft-tissue]
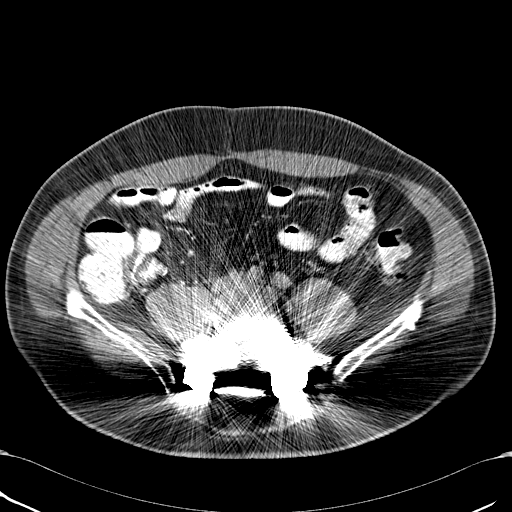
[im 52/99  soft-tissue]
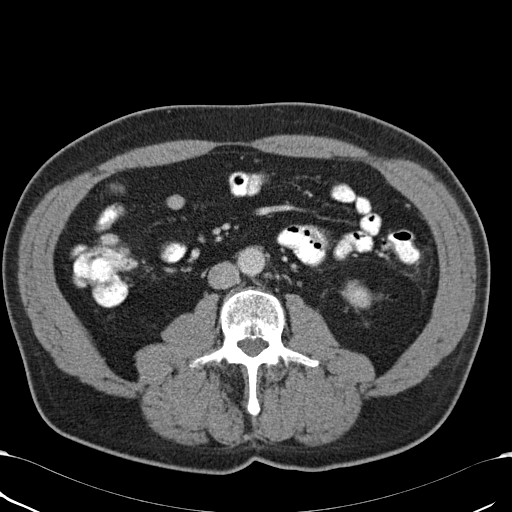
[im 58/99  soft-tissue]
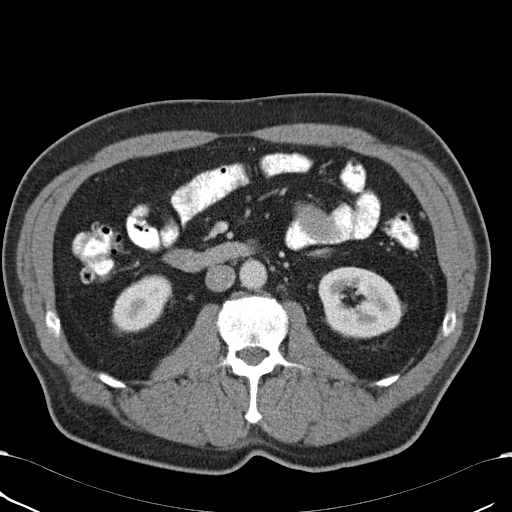
[im 64/99  soft-tissue]
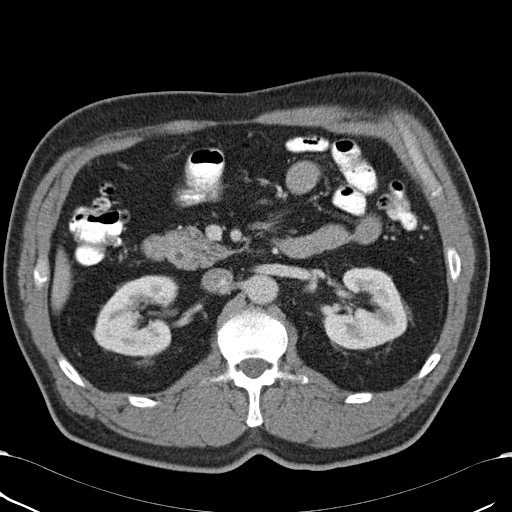
[im 64/99  bone]
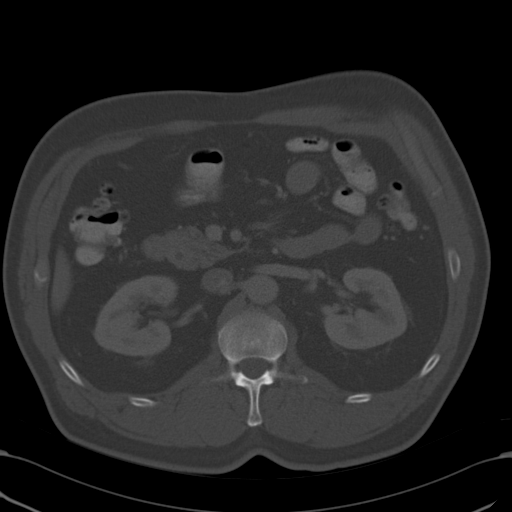
[im 70/99  soft-tissue]
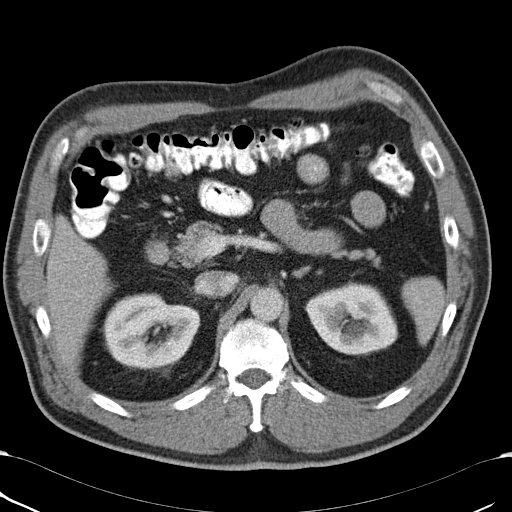
[im 75/99  soft-tissue]
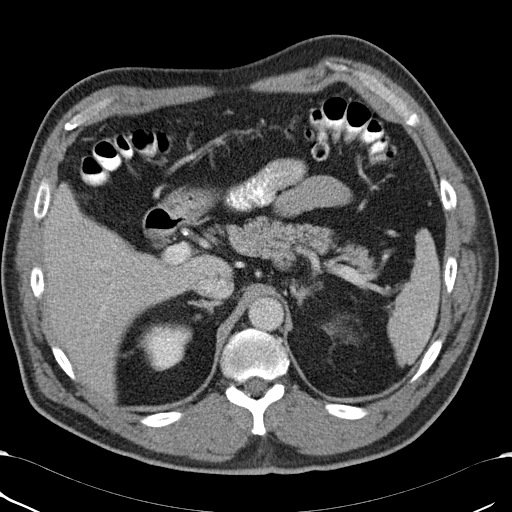
[im 87/99  soft-tissue]
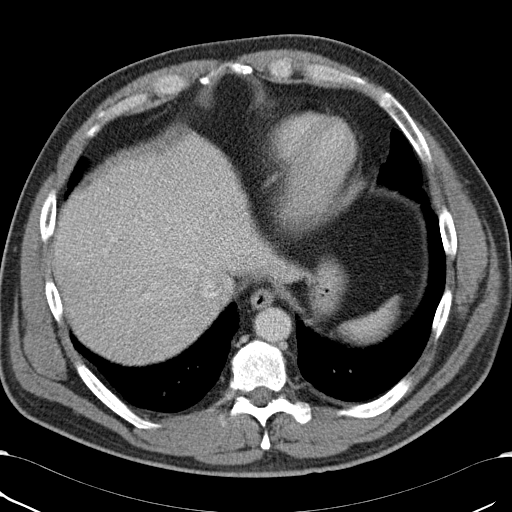
[im 93/99  soft-tissue]
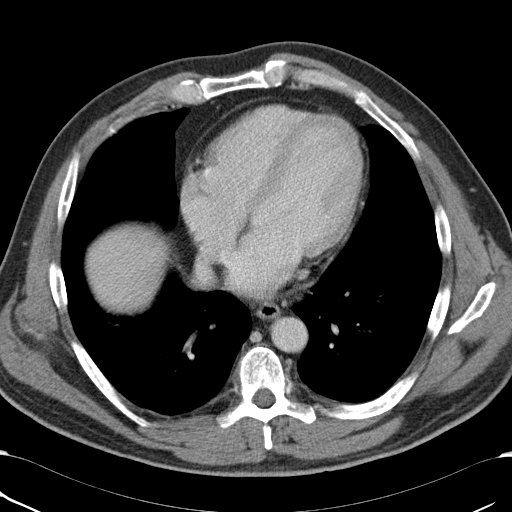

[Series 3: coronal · coronal · 0.70mm/px · 3 of 157 slices shown]
[im 53/157  soft-tissue]
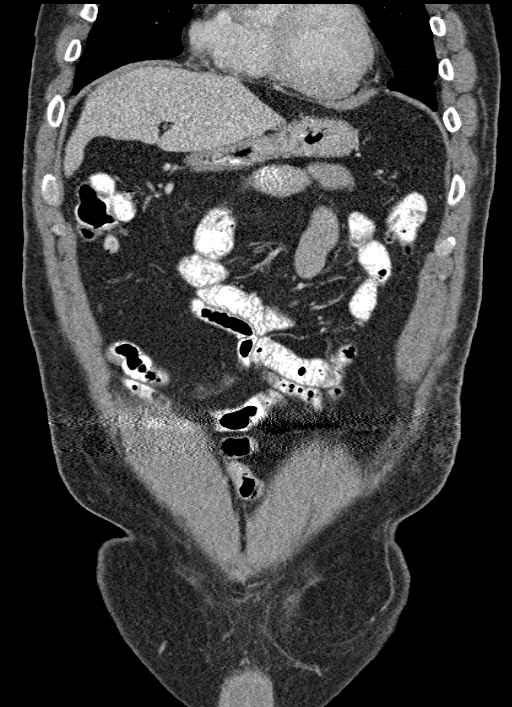
[im 70/157  soft-tissue]
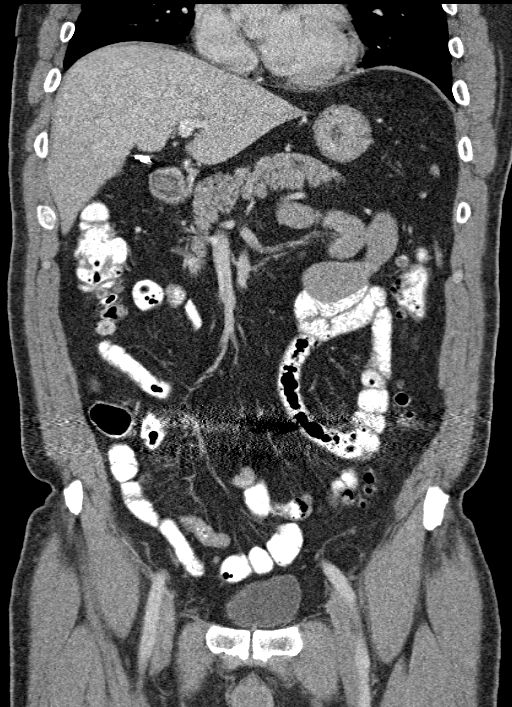
[im 87/157  soft-tissue]
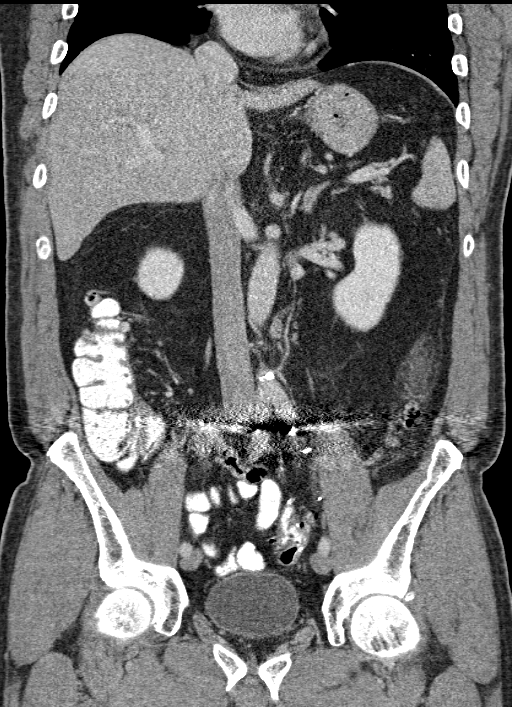

[16 of 46 positions shown; findings below may reference images not displayed]

FINDINGS: Lower chest:  No acute abnormality.

Hepatobiliary: The liver is unremarkable. The patient is status post
cholecystectomy. There is no evidence of biliary dilatation.

Pancreas: Unremarkable

Spleen: Unremarkable

Adrenals/Urinary Tract: The kidneys, bladder and adrenal glands are
unremarkable except for a tiny probable left renal cyst.

Stomach/Bowel: Mild focal wall thickening of the mid descending
colon with adjacent inflammation is compatible with diverticulitis.
There is no evidence of pneumoperitoneum, abscess or bowel
obstruction. Diverticulosis involving the majority of the colon
identified.

Vascular/Lymphatic: Aortic atherosclerotic calcifications noted
without aneurysm. No enlarged lymph nodes identified.

Reproductive: Unremarkable

Other: No free fluid.

Musculoskeletal: No acute or suspicious abnormality. Fusion changes
at L5-S1 noted.
IMPRESSION: Focal diverticulitis involving the mid descending colon. No evidence
of pneumoperitoneum or abscess.

Aortic atherosclerosis.

## 2017-04-14 DIAGNOSIS — F32A Depression, unspecified: Secondary | ICD-10-CM | POA: Insufficient documentation

## 2017-06-09 DIAGNOSIS — Z0000 Encounter for general adult medical examination without abnormal findings: Secondary | ICD-10-CM | POA: Insufficient documentation

## 2018-03-28 DIAGNOSIS — E663 Overweight: Secondary | ICD-10-CM | POA: Insufficient documentation

## 2018-05-09 DIAGNOSIS — R0989 Other specified symptoms and signs involving the circulatory and respiratory systems: Secondary | ICD-10-CM | POA: Insufficient documentation

## 2019-05-01 DIAGNOSIS — R7309 Other abnormal glucose: Secondary | ICD-10-CM | POA: Insufficient documentation

## 2020-03-13 ENCOUNTER — Ambulatory Visit (INDEPENDENT_AMBULATORY_CARE_PROVIDER_SITE_OTHER): Payer: Medicare Other | Admitting: Gastroenterology

## 2020-03-13 ENCOUNTER — Encounter (INDEPENDENT_AMBULATORY_CARE_PROVIDER_SITE_OTHER): Payer: Self-pay | Admitting: Gastroenterology

## 2020-03-13 ENCOUNTER — Other Ambulatory Visit (INDEPENDENT_AMBULATORY_CARE_PROVIDER_SITE_OTHER): Payer: Self-pay | Admitting: Gastroenterology

## 2020-03-13 ENCOUNTER — Other Ambulatory Visit: Payer: Self-pay

## 2020-03-13 VITALS — BP 145/97 | HR 66 | Temp 98.6°F | Wt 229.0 lb

## 2020-03-13 DIAGNOSIS — R1032 Left lower quadrant pain: Secondary | ICD-10-CM | POA: Diagnosis not present

## 2020-03-13 DIAGNOSIS — K219 Gastro-esophageal reflux disease without esophagitis: Secondary | ICD-10-CM

## 2020-03-13 DIAGNOSIS — Z800 Family history of malignant neoplasm of digestive organs: Secondary | ICD-10-CM | POA: Diagnosis not present

## 2020-03-13 NOTE — Patient Instructions (Signed)
Please contact me if any symptoms return. Continue fiber and fluid intake.

## 2020-03-13 NOTE — Progress Notes (Signed)
Patient profile: Alexander Shepherd is a 69 y.o. male seen for evaluation of llq pain.   History of Present Illness: Alexander Shepherd is seen today for follow-up, he was last seen in 2017 for abdominal pain.  Patient reports initially abdominal pain in 2017 being related to diverticulitis and had a CT as below.  He reports being placed on multiple courses of Cipro Flagyl.  Ultimately he was at physical therapy in 2017 and had a pressure point palpated and pain spontaneously resolved. He has a history of a sigmoid resection in 2014 for diverticulitis.  He felt well until approximately 2 weeks ago when he developed the same pinpoint-like pain in his left lower quadrant.  At that point he began palpating the abdomen similar to what his physical therapist had had prior & his pain has resolved at this point. When he had the pain he had 2 days of constipation but he is back to moving his stools daily which is his baseline.  He denies any blood in stool or dark stools.  He states his recent pain did not feel like his prior diverticulitis pain (that pain worsened to touch and more recent pain improved w/ palpation)  He denies any GERD symptoms on Prevacid 15 mg daily.  No dysphagia.  Occasional nausea when takes medications on empty stomach and drinks coffee on empty stomach.  Denies vomiting.  Appetite good & weight stable.   Eats an apple daily for bowel regularity to ensure he is getting adequate fiber.  Wt Readings from Last 3 Encounters:  02/11/16 216 lb (98 kg)  01/15/16 213 lb 8 oz (96.8 kg)  07/18/15 220 lb (99.8 kg)     Last Colonoscopy: 01/2016- The examined portion of the ileum was normal.   - Diverticulosis in the descending colon, in the transverse colon, at the hepatic flexure and in the ascending colon.  - Patent end-to-end colo-colonic anastomosis,  characterized by healthy appearing mucosa.   - Internal hemorrhoids.  - No specimens collected    Last Endoscopy: per patient many years  ago   Past Medical History:  Past Medical History:  Diagnosis Date  . High cholesterol   . Hypertension     Problem List: Patient Active Problem List   Diagnosis Date Noted  . Diverticulitis of colon 02/11/2016  . Essential hypertension 01/15/2016  . High cholesterol 01/15/2016    Past Surgical History: Past Surgical History:  Procedure Laterality Date  . BACK SURGERY    . CHOLECYSTECTOMY     abdominal pain  . COLONOSCOPY N/A 01/24/2016   Procedure: COLONOSCOPY;  Surgeon: Alexander Hippo, MD;  Location: [facility];  Service: Endoscopy;  Laterality: N/A;  7:30  . NECK SURGERY    . sigmoid colon surgery     in 2014    Allergies: No Known Allergies    Home Medications:  Current Outpatient Medications:  .  aspirin 81 MG tablet, Take 81 mg by mouth daily., Disp: , Rfl:  .  labetalol (NORMODYNE) 200 MG tablet, Take 400 mg by mouth 2 (two) times daily., Disp: , Rfl:  .  lansoprazole (PREVACID) 15 MG capsule, Take 15 mg by mouth daily at 12 noon., Disp: , Rfl:  .  Multiple Vitamins-Minerals (ONE-A-DAY 50 PLUS PO), Take by mouth. 1 tab once a day, Disp: , Rfl:  .  rosuvastatin (CRESTOR) 5 MG tablet, Take 2.5 mg by mouth every other day., Disp: , Rfl:  .  Vitamin D, Cholecalciferol, 1000 UNITS  TABS, Take 1,000 Units by mouth daily., Disp: , Rfl:  .  triamcinolone (NASACORT ALLERGY 24HR) 55 MCG/ACT AERO nasal inhaler, Place 2 sprays into the nose daily. (Patient not taking: Reported on 03/13/2020), Disp: , Rfl:    Family History: family history includes Cancer in his father.    Social History:   reports that he has never smoked. He has never used smokeless tobacco. He reports current alcohol use. He reports that he does not use drugs.   Review of Systems: Constitutional: Denies weight loss/weight gain  Eyes: No changes in vision. ENT: No oral lesions, sore throat.  GI: see HPI.  Heme/Lymph: No easy bruising.  CV: No chest pain.  GU: No hematuria.  Integumentary: No  rashes.  Neuro: No headaches.  Psych: No depression/anxiety.  Endocrine: No heat/cold intolerance.  Allergic/Immunologic: No urticaria.  Resp: No cough, SOB.  Musculoskeletal: No joint swelling.    Physical Examination: There were no vitals taken for this visit. Gen: NAD, alert and oriented x 4 HEENT: PEERLA, EOMI, Neck: supple, no JVD Chest: CTA bilaterally, no wheezes, crackles, or other adventitious sounds CV: RRR, no m/g/c/r Abd: soft, NT, ND, +BS in all four quadrants; no HSM, guarding, ridigity, or rebound tenderness.  Specifically no left lower quadrant pain. Ext: no edema, well perfused with 2+ pulses, Skin: no rash or lesions noted on observed skin Lymph: no noted LAD  Data:  2017-IMPRESSION: CT a/p  Focal diverticulitis involving the mid descending colon. No evidence of pneumoperitoneum or abscess. Aortic atherosclerosis  03/16/16-IMPRESSION: Interval improvement and near complete resolution of previously described descending colonic wall thickening and surrounding fat stranding most compatible with resolving diverticulitis. Aortic atherosclerosis.    Assessment/Plan: Alexander Shepherd is a 69 y.o. male  Alexander Shepherd was seen today for new patient (initial visit).  Diagnoses and all orders for this visit:  LLQ pain  Chronic GERD  Family history of colon cancer      1.  Left lower quadrant pain-has resolved with the patient palpating his abdomen and is currently free of pain.  Abdominal exam today not consistent with diverticulitis.  Reports history of musculoskeletal like pain as he has had a back surgery with an anterior approach.  Bowels are moving normally at this point.  We will hold off on further evaluation w/ imaging given pain is resolved.  He is to contact me if pain returns  2.  GERD-well-controlled with Prevacid daily.  He does have some occasional nausea if he takes medication on empty stomach with coffee.  He will try eating a small breakfast with his  medicines except Prevacid to be taken before. To notify me if worsens.    3.  Family history of colon cancer-due for colonoscopy June 2021.  He will contact us with any return of symptoms.  Otherwise due for follow-up next June.    I personally performed the service, non-incident to. (WP)  Tawni Pummel, [facility]

## 2020-04-15 ENCOUNTER — Telehealth (INDEPENDENT_AMBULATORY_CARE_PROVIDER_SITE_OTHER): Payer: Self-pay | Admitting: Gastroenterology

## 2020-04-15 DIAGNOSIS — R1032 Left lower quadrant pain: Secondary | ICD-10-CM

## 2020-04-15 NOTE — Telephone Encounter (Signed)
Wife left message stating patient is still in a lot of pain and is not feeling well - please advise - ph# [phone]

## 2020-04-16 NOTE — Telephone Encounter (Signed)
If still having abd pain and GI issues would recommend CT a/p given his hx of diverticulitis. He was feeling well at his last OV so we decided to hold off on imaging. Please check if he would like to schedule CT

## 2020-04-16 NOTE — Telephone Encounter (Signed)
Called and lvm for patient call us back. 

## 2020-04-17 NOTE — Telephone Encounter (Signed)
Dorinda Hill - patients wife called back, can you reach out to her today? Thanks.

## 2020-04-17 NOTE — Telephone Encounter (Signed)
Tried to call back lvm  on home , wife  phone isn't working

## 2020-04-18 NOTE — Telephone Encounter (Signed)
Alexander Shepherd - wife called back with # [phone] - do you mind reaching out to triage patients symptoms? Thanks!

## 2020-04-18 NOTE — Telephone Encounter (Signed)
Spoke with patient 's wife , the number that we give was in correct ,  corrected that the number  and sent the link for patient to set up my chart to since patient was having issues getting in touch with the office. She is going to discuss this information with patient call us back or sent [place] a message.

## 2020-04-23 ENCOUNTER — Other Ambulatory Visit (INDEPENDENT_AMBULATORY_CARE_PROVIDER_SITE_OTHER): Payer: Self-pay | Admitting: *Deleted

## 2020-04-23 DIAGNOSIS — Z01812 Encounter for preprocedural laboratory examination: Secondary | ICD-10-CM

## 2020-04-23 NOTE — Telephone Encounter (Signed)
Patient's wife called and they want to proceed with CT at [facility] - please put order in so I can schedule - if you need it done fairly quick make it ASAP so I can get within next week or so, if not it will probably end of month before they will have something. thanks

## 2020-04-23 NOTE — Telephone Encounter (Signed)
Order placed for CT to be done ASAP.

## 2020-04-23 NOTE — Telephone Encounter (Signed)
CT [place]'d 04/26/20 at 230 (215), NPO 4 hours., pick up contrast, patient aware

## 2020-04-26 ENCOUNTER — Ambulatory Visit (HOSPITAL_COMMUNITY)
Admission: RE | Admit: 2020-04-26 | Discharge: 2020-04-26 | Disposition: A | Discharge status: Home / Self Care | Payer: Medicare Other | Source: Ambulatory Visit | Attending: Gastroenterology | Admitting: Gastroenterology

## 2020-04-26 ENCOUNTER — Other Ambulatory Visit: Payer: Self-pay

## 2020-04-26 ENCOUNTER — Encounter (HOSPITAL_COMMUNITY): Payer: Self-pay

## 2020-04-26 DIAGNOSIS — R1032 Left lower quadrant pain: Secondary | ICD-10-CM | POA: Diagnosis not present

## 2020-04-26 LAB — POCT I-STAT CREATININE: Creatinine, Ser: 1.1 mg/dL (ref 0.61–1.24)

## 2020-04-26 MED ORDER — IOHEXOL 300 MG/ML  SOLN
100.0000 mL | Freq: Once | INTRAMUSCULAR | Status: AC | PRN
  Administered 2020-04-26: 100 mL via INTRAVENOUS

## 2020-10-28 DIAGNOSIS — R7989 Other specified abnormal findings of blood chemistry: Secondary | ICD-10-CM | POA: Insufficient documentation

## 2020-10-28 DIAGNOSIS — I700 Atherosclerosis of aorta: Secondary | ICD-10-CM | POA: Insufficient documentation

## 2021-01-02 ENCOUNTER — Encounter (INDEPENDENT_AMBULATORY_CARE_PROVIDER_SITE_OTHER): Payer: Self-pay | Admitting: *Deleted

## 2021-03-11 ENCOUNTER — Encounter (INDEPENDENT_AMBULATORY_CARE_PROVIDER_SITE_OTHER): Payer: Self-pay | Admitting: Internal Medicine

## 2021-03-11 ENCOUNTER — Ambulatory Visit (INDEPENDENT_AMBULATORY_CARE_PROVIDER_SITE_OTHER): Payer: Medicare PPO | Admitting: Internal Medicine

## 2021-03-11 ENCOUNTER — Other Ambulatory Visit: Payer: Self-pay

## 2021-03-11 DIAGNOSIS — Z800 Family history of malignant neoplasm of digestive organs: Secondary | ICD-10-CM | POA: Insufficient documentation

## 2021-03-11 DIAGNOSIS — Z8719 Personal history of other diseases of the digestive system: Secondary | ICD-10-CM | POA: Insufficient documentation

## 2021-03-11 NOTE — Progress Notes (Signed)
Presenting complaint;  History of diverticulitis and LLQ abdominal pain.  Database and subjective:  Patient is 70 year old Caucasian male who has a history of diverticulitis and status post sigmoid colon resection in 2014 who was last seen 1 year ago.  He was having intermittent LLQ abdominal pain.  He had abdominal pelvic CT in September 2021 and this study was negative for diverticulitis. He also has a history of GERD and maintained on dietary measures and OTC PPI. Last colonoscopy was in June 2017.  Patient states he is doing well.  He has not had abdominal pain lately.  His bowels move every day.  He denies melena or rectal bleeding.  He feels lansoprazole is working well.  He rarely has heartburn.  He denies dysphagia or throat symptoms.  He says he had blood work in April this year and it was all normal. He walks about 3 times in a week and each time he walks 1-1/2 to 2 miles.  He also rides bike regularly.  Family history significant for colon carcinoma in father who was diagnosed at age 76.  He already had stage IV disease and was treated with chemotherapy and left 2 years. Patient has 4 sisters and none of them have colon polyps or carcinoma.  Current Medications: Outpatient Encounter Medications as of 03/11/2021  Medication Sig   aspirin 81 MG tablet Take 81 mg by mouth daily.   hydrochlorothiazide (HYDRODIURIL) 25 MG tablet Take 25 mg by mouth daily.   labetalol (NORMODYNE) 200 MG tablet Take 400 mg by mouth 2 (two) times daily.   lansoprazole (PREVACID) 15 MG capsule Take 15 mg by mouth daily at 12 noon.   Multiple Vitamins-Minerals (ONE-A-DAY 50 PLUS PO) Take by mouth. 1 tab once a day   OVER THE COUNTER MEDICATION Vit c 2000 mg daily Vit D3 4,000 mg daily Zinc 50 mg daily   rosuvastatin (CRESTOR) 5 MG tablet Take 2.5 mg by mouth every other day.   [DISCONTINUED] Vitamin D, Cholecalciferol, 1000 UNITS TABS Take 1,000 Units by mouth daily.   [DISCONTINUED] triamcinolone  (NASACORT ALLERGY 24HR) 55 MCG/ACT AERO nasal inhaler Place 2 sprays into the nose daily. (Patient not taking: Reported on 03/13/2020)   No facility-administered encounter medications on file as of 03/11/2021.     Objective: Physical ExamBlood pressure 115/79 pulse 74, temperature 98.9 F (36.7 C), height 5\' 11"  (1.803 m), weight 227 lb (97.977 kg). Patient is alert and in no acute distress. He is wearing a mask. Conjunctiva is pink. Sclera is nonicteric Oropharyngeal mucosa is normal. No neck masses or thyromegaly noted. Cardiac exam with regular rhythm normal S1 and S2. No murmur or gallop noted. Lungs are clear to auscultation. Abdomen is full but soft and nontender with organomegaly or masses. No LE edema or clubbing noted.  Labs/studies Results:  Abdominal pelvic CT on 04/27/2020 revealed pancolonic diverticulosis without evidence of diverticulitis and a small fat-containing left inguinal hernia.  Assessment:  #1.  History of LLQ abdominal pain.  He has a history of diverticulitis.  He possibly also has IBS.  He has not experienced pain lately.  #2.  Chronic GERD.  He is doing fine with OTC low-dose PPI.  #3.  Patient is high risk for colorectal carcinoma given family history of CRC in father.  Last colonoscopy was in June 2017.  He is due for exam this year.  Plan:  We will schedule high rescreening colonoscopy in November 2022. Patient should continue high-fiber diet. Patient will call office if  LLQ abdominal pain recurs. Office visit on as-needed basis.

## 2021-03-11 NOTE — Patient Instructions (Signed)
Colonoscopy to be scheduled in November,2022

## 2021-05-01 ENCOUNTER — Other Ambulatory Visit (INDEPENDENT_AMBULATORY_CARE_PROVIDER_SITE_OTHER): Payer: Self-pay

## 2021-05-01 DIAGNOSIS — Z800 Family history of malignant neoplasm of digestive organs: Secondary | ICD-10-CM

## 2021-05-01 DIAGNOSIS — Z1211 Encounter for screening for malignant neoplasm of colon: Secondary | ICD-10-CM

## 2021-05-22 ENCOUNTER — Telehealth (INDEPENDENT_AMBULATORY_CARE_PROVIDER_SITE_OTHER): Payer: Self-pay

## 2021-05-22 ENCOUNTER — Encounter (INDEPENDENT_AMBULATORY_CARE_PROVIDER_SITE_OTHER): Payer: Self-pay

## 2021-05-22 MED ORDER — SUTAB 1479-225-188 MG PO TABS: 1.0000 | ORAL_TABLET | Freq: Once | ORAL | 0 refills | Status: AC

## 2021-05-22 NOTE — Telephone Encounter (Signed)
Alexander Shepherd, CMA  

## 2021-05-22 NOTE — Telephone Encounter (Signed)
Referring MD/PCP: Pomposini  Procedure: Tcs  Reason/Indication:  Screening, Fam Hx of colon ca  Has patient had this procedure before?  yes  If so, when, by whom and where?  01/2016  Is there a family history of colon cancer?  yes  Who?  What age when diagnosed?  father  Is patient diabetic? If yes, Type 1 or Type 2   no      Does patient have prosthetic heart valve or mechanical valve?  no  Do you have a pacemaker/defibrillator?  no  Has patient ever had endocarditis/atrial fibrillation? no  Does patient use oxygen? No   Has patient had joint replacement within last 12 months?  no  Is patient constipated or do they take laxatives? no  Does patient have a history of alcohol/drug use?  no  Have you had a stroke/heart attack last 6 mths? no  Do you take medicine for weight loss?  no  For male patients,: do you still have your menstrual cycle? N/A  Is patient on blood thinner such as Coumadin, Plavix and/or Aspirin? yes  Medications: asa 81 mg daily, labetalol 200mg  2 tab bid, rosuvastatin 5 mg daily, lansoprazole 15 mg daily, hctz 25 mg daily, Vit c 1000 mg bid, Vit D 3 2000 mg bid  Allergies: nkda  Medication Adjustment per Dr  Hold asa 81 mg 2 days prior  Procedure date & time: Thursday 06/26/21 at 8:05

## 2021-05-28 ENCOUNTER — Encounter (INDEPENDENT_AMBULATORY_CARE_PROVIDER_SITE_OTHER): Payer: Self-pay

## 2021-06-26 ENCOUNTER — Encounter (HOSPITAL_COMMUNITY): Payer: Self-pay | Admitting: Internal Medicine

## 2021-06-26 ENCOUNTER — Encounter (HOSPITAL_COMMUNITY)
Admission: RE | Disposition: A | Discharge status: Home / Self Care | Payer: Self-pay | Source: Home / Self Care | Attending: Internal Medicine

## 2021-06-26 ENCOUNTER — Ambulatory Visit (HOSPITAL_COMMUNITY)
Admission: RE | Admit: 2021-06-26 | Discharge: 2021-06-26 | Disposition: A | Discharge status: Home / Self Care | Payer: Medicare PPO | Attending: Internal Medicine | Admitting: Internal Medicine

## 2021-06-26 ENCOUNTER — Encounter (INDEPENDENT_AMBULATORY_CARE_PROVIDER_SITE_OTHER): Payer: Self-pay | Admitting: *Deleted

## 2021-06-26 ENCOUNTER — Other Ambulatory Visit: Payer: Self-pay

## 2021-06-26 DIAGNOSIS — Z800 Family history of malignant neoplasm of digestive organs: Secondary | ICD-10-CM | POA: Insufficient documentation

## 2021-06-26 DIAGNOSIS — Z7982 Long term (current) use of aspirin: Secondary | ICD-10-CM | POA: Insufficient documentation

## 2021-06-26 DIAGNOSIS — Z1211 Encounter for screening for malignant neoplasm of colon: Secondary | ICD-10-CM | POA: Insufficient documentation

## 2021-06-26 DIAGNOSIS — Z980 Intestinal bypass and anastomosis status: Secondary | ICD-10-CM | POA: Insufficient documentation

## 2021-06-26 DIAGNOSIS — K644 Residual hemorrhoidal skin tags: Secondary | ICD-10-CM | POA: Diagnosis not present

## 2021-06-26 DIAGNOSIS — K648 Other hemorrhoids: Secondary | ICD-10-CM | POA: Diagnosis not present

## 2021-06-26 DIAGNOSIS — K5730 Diverticulosis of large intestine without perforation or abscess without bleeding: Secondary | ICD-10-CM | POA: Diagnosis not present

## 2021-06-26 HISTORY — DX: Other specified postprocedural states: Z98.890

## 2021-06-26 HISTORY — DX: Other specified postprocedural states: R11.2

## 2021-06-26 HISTORY — PX: COLONOSCOPY: SHX5424

## 2021-06-26 LAB — HM COLONOSCOPY

## 2021-06-26 SURGERY — COLONOSCOPY
Anesthesia: Moderate Sedation

## 2021-06-26 MED ORDER — SODIUM CHLORIDE 0.9 % IV SOLN: INTRAVENOUS | Status: DC

## 2021-06-26 MED ORDER — MIDAZOLAM HCL 5 MG/5ML IJ SOLN
INTRAMUSCULAR | Status: AC
  Filled 2021-06-26: qty 10

## 2021-06-26 MED ORDER — MIDAZOLAM HCL 5 MG/5ML IJ SOLN
INTRAMUSCULAR | Status: DC | PRN
  Administered 2021-06-26 (×3): 2 mg via INTRAVENOUS

## 2021-06-26 MED ORDER — MEPERIDINE HCL 50 MG/ML IJ SOLN
INTRAMUSCULAR | Status: DC
Start: 2021-06-26 — End: 2021-06-26
  Filled 2021-06-26: qty 1

## 2021-06-26 MED ORDER — MEPERIDINE HCL 50 MG/ML IJ SOLN
INTRAMUSCULAR | Status: DC | PRN
Start: 2021-06-26 — End: 2021-06-26
  Administered 2021-06-26 (×3): 25 mg via INTRAVENOUS

## 2021-06-26 MED ORDER — STERILE WATER FOR IRRIGATION IR SOLN
Status: DC | PRN
  Administered 2021-06-26: 100 mL

## 2021-06-26 NOTE — Op Note (Signed)
[facility] Patient Name: Alexander Shepherd Procedure Date: 06/26/2021 7:20 AM MRN: [id number] Date of Birth: 01/17/1951 Attending MD: Lionel December , MD CSN: [id number] Age: 70 Admit Type: Outpatient Procedure:                Colonoscopy Indications:              Screening in patient at increased risk: Colorectal                            cancer in father 69 or older Providers:                Lionel December, MD, Edrick Kins, RN, Pandora Leiter,                            Technician Referring MD:             Rande Brunt. Pomposini, MD Medicines:                Meperidine 75 mg IV, Midazolam 6 mg IV Complications:            No immediate complications. Estimated Blood Loss:     Estimated blood loss: none. Procedure:                Pre-Anesthesia Assessment:                           - Prior to the procedure, a History and Physical                            was performed, and patient medications and                            allergies were reviewed. The patient's tolerance of                            previous anesthesia was also reviewed. The risks                            and benefits of the procedure and the sedation                            options and risks were discussed with the patient.                            All questions were answered, and informed consent                            was obtained. Prior Anticoagulants: The patient has                            taken no previous anticoagulant or antiplatelet                            agents except for aspirin. ASA Grade Assessment: II                            -  A patient with mild systemic disease. After                            reviewing the risks and benefits, the patient was                            deemed in satisfactory condition to undergo the                            procedure.                           After obtaining informed consent, the colonoscope                            was passed under direct  vision. Throughout the                            procedure, the patient's blood pressure, pulse, and                            oxygen saturations were monitored continuously. The                            [id number]-[id number] ([id number]) was introduced through the                            anus and advanced to the the terminal ileum, with                            identification of the appendiceal orifice and IC                            valve. The colonoscopy was performed without                            difficulty. The patient tolerated the procedure                            well. The quality of the bowel preparation was                            adequate to identify polyps. The terminal ileum,                            ileocecal valve, appendiceal orifice, and rectum                            were photographed. Scope In: 7:43:53 AM Scope Out: 7:57:35 AM Scope Withdrawal Time: 0 hours 11 minutes 18 seconds  Total Procedure Duration: 0 hours 13 minutes 42 seconds  Findings:      The perianal and digital rectal examinations were normal.      The terminal ileum appeared normal.      Scattered diverticula were found in  the sigmoid colon, descending colon,       splenic flexure, transverse colon, hepatic flexure and ascending colon.      There was evidence of a prior end-to-side colo-colonic anastomosis in       the distal sigmoid colon. This was patent and was characterized by       healthy appearing mucosa.      External and internal hemorrhoids were found during retroflexion. The       hemorrhoids were small. Impression:               - The examined portion of the ileum was normal.                           - Diverticulosis in the sigmoid colon, in the                            descending colon, at the splenic flexure, in the                            transverse colon, at the hepatic flexure and in the                            ascending colon.                           - Patent  end-to-side colo-colonic anastomosis,                            characterized by healthy appearing mucosa.                           - External and internal hemorrhoids.                           - No specimens collected. Moderate Sedation:      Moderate (conscious) sedation was administered by the endoscopy nurse       and supervised by the endoscopist. The following parameters were       monitored: oxygen saturation, heart rate, blood pressure, CO2       capnography and response to care. Total physician intraservice time was       22 minutes. Recommendation:           - Patient has a contact number available for                            emergencies. The signs and symptoms of potential                            delayed complications were discussed with the                            patient. Return to normal activities tomorrow.                            Written discharge instructions were provided to the  patient.                           - High fiber diet today.                           - Continue present medications.                           - Repeat colonoscopy in 5 years for screening                            purposes. Procedure Code(s):        --- Professional ---                           [phone], Colonoscopy, flexible; diagnostic, including                            collection of specimen(s) by brushing or washing,                            when performed (separate procedure)                           G0500, Moderate sedation services provided by the                            same physician or other qualified health care                            professional performing a gastrointestinal                            endoscopic service that sedation supports,                            requiring the presence of an independent trained                            observer to assist in the monitoring of the                            patient's  level of consciousness and physiological                            status; initial 15 minutes of intra-service time;                            patient age 19 years or older (additional time may                            be reported with 28786, as appropriate) Diagnosis Code(s):        --- Professional ---  Z80.0, Family history of malignant neoplasm of                            digestive organs                           K64.8, Other hemorrhoids                           Z98.0, Intestinal bypass and anastomosis status                           K57.30, Diverticulosis of large intestine without                            perforation or abscess without bleeding CPT copyright 2019 American [employer] Association. All rights reserved. The codes documented in this report are preliminary and upon coder review may  be revised to meet current compliance requirements. Lionel December, MD Lionel December, MD 06/26/2021 8:11:33 AM This report has been signed electronically. Number of Addenda: 0

## 2021-06-26 NOTE — H&P (Addendum)
Alexander Shepherd is an 70 y.o. male.   Chief Complaint: Patient is here for colonoscopy. HPI: Patient is 70 year old Caucasian male who is here for high rescreening colonoscopy.  His last exam was unremarkable in June 2017.  He denies abdominal pain change in bowel habits diarrhea or rectal bleeding.  He has good appetite.  His weight has been stable. Family history significant for colon carcinoma in father who was 75 at the time of diagnosis and died 2 years later of liver mets. Last aspirin dose was 5 days ago.  Past Medical History:  Diagnosis Date   GERD (gastroesophageal reflux disease)    High cholesterol    Hypertension    PONV (postoperative nausea and vomiting)     Past Surgical History:  Procedure Laterality Date   BACK SURGERY     CHOLECYSTECTOMY     abdominal pain   COLONOSCOPY N/A 01/24/2016   Procedure: COLONOSCOPY;  Surgeon: Malissa Hippo, MD;  Location: [facility];  Service: Endoscopy;  Laterality: N/A;  7:30   NECK SURGERY     sigmoid colon surgery     in 2014    Family History  Problem Relation Age of Onset   Cancer Father    Social History:  reports that he has never smoked. He has never used smokeless tobacco. He reports that he does not currently use alcohol. He reports that he does not use drugs.  Allergies: No Known Allergies  Medications Prior to Admission  Medication Sig Dispense Refill   aspirin 81 MG tablet Take 81 mg by mouth daily.     hydrochlorothiazide (HYDRODIURIL) 25 MG tablet Take 25 mg by mouth daily.     labetalol (NORMODYNE) 200 MG tablet Take 400 mg by mouth 2 (two) times daily.     lansoprazole (PREVACID) 15 MG capsule Take 15 mg by mouth daily with supper.     Probiotic Product (PROBIOTIC PO) Take 1 tablet by mouth daily.     rosuvastatin (CRESTOR) 5 MG tablet Take 2.5-5 mg by mouth See admin instructions. Alternate taking one-half tablet one day and one tablet the next day.     VITAMIN D PO Take 1 tablet by mouth daily.      No  results found for this or any previous visit (from the past 48 hour(s)). No results found.  Review of Systems  Blood pressure (!) 148/82, pulse 69, temperature 98.5 F (36.9 C), temperature source Oral, resp. rate (!) 21, height 5\' 11"  (1.803 m), weight 99.8 kg, SpO2 98 %. Physical Exam HENT:     Mouth/Throat:     Mouth: Mucous membranes are moist.     Pharynx: Oropharynx is clear.  Eyes:     General: No scleral icterus.    Conjunctiva/sclera: Conjunctivae normal.  Cardiovascular:     Rate and Rhythm: Normal rate and regular rhythm.     Heart sounds: Normal heart sounds. No murmur heard. Pulmonary:     Effort: Pulmonary effort is normal.     Breath sounds: Normal breath sounds.  Abdominal:     Comments: Abdomen is symmetrical.  There is scar in left inguinal region as well as low midline scar.  On palpation abdomen is soft and nontender with organomegaly or masses.  Musculoskeletal:        General: No swelling.     Cervical back: Neck supple.  Lymphadenopathy:     Cervical: No cervical adenopathy.  Skin:    General: Skin is warm and dry.  Neurological:  Mental Status: He is alert.     Assessment/Plan  High risk screening colonoscopy. Father was diagnosed with colon cancer at age 27 and died of liver mets 2 years later.  Lionel December, MD 06/26/2021, 7:29 AM

## 2021-06-26 NOTE — Discharge Instructions (Signed)
Resume usual medication including aspirin as before. High-fiber diet. No driving for 24 hours. Next colonoscopy in 5 years.

## 2021-07-02 ENCOUNTER — Encounter (HOSPITAL_COMMUNITY): Payer: Self-pay | Admitting: Internal Medicine

## 2023-05-12 DIAGNOSIS — R739 Hyperglycemia, unspecified: Secondary | ICD-10-CM | POA: Insufficient documentation

## 2023-05-31 DIAGNOSIS — I700 Atherosclerosis of aorta: Secondary | ICD-10-CM | POA: Insufficient documentation

## 2023-05-31 DIAGNOSIS — Z79891 Long term (current) use of opiate analgesic: Secondary | ICD-10-CM | POA: Insufficient documentation

## 2023-05-31 DIAGNOSIS — E119 Type 2 diabetes mellitus without complications: Secondary | ICD-10-CM | POA: Insufficient documentation

## 2023-05-31 DIAGNOSIS — E1165 Type 2 diabetes mellitus with hyperglycemia: Secondary | ICD-10-CM | POA: Insufficient documentation

## 2024-04-24 ENCOUNTER — Ambulatory Visit (INDEPENDENT_AMBULATORY_CARE_PROVIDER_SITE_OTHER): Admitting: Family Medicine

## 2024-04-24 ENCOUNTER — Encounter: Payer: Self-pay | Admitting: Family Medicine

## 2024-04-24 VITALS — BP 150/88 | HR 87 | Ht 71.0 in | Wt 210.0 lb

## 2024-04-24 DIAGNOSIS — K219 Gastro-esophageal reflux disease without esophagitis: Secondary | ICD-10-CM

## 2024-04-24 DIAGNOSIS — I10 Essential (primary) hypertension: Secondary | ICD-10-CM | POA: Diagnosis not present

## 2024-04-24 DIAGNOSIS — E1169 Type 2 diabetes mellitus with other specified complication: Secondary | ICD-10-CM | POA: Diagnosis not present

## 2024-04-24 DIAGNOSIS — E119 Type 2 diabetes mellitus without complications: Secondary | ICD-10-CM

## 2024-04-24 DIAGNOSIS — Z7984 Long term (current) use of oral hypoglycemic drugs: Secondary | ICD-10-CM

## 2024-04-24 DIAGNOSIS — Z130 Encounter for screening for diseases of the blood and blood-forming organs and certain disorders involving the immune mechanism: Secondary | ICD-10-CM | POA: Diagnosis not present

## 2024-04-24 DIAGNOSIS — E785 Hyperlipidemia, unspecified: Secondary | ICD-10-CM

## 2024-04-24 MED ORDER — ROSUVASTATIN CALCIUM 5 MG PO TABS: 5.0000 mg | ORAL_TABLET | Freq: Every day | ORAL | 3 refills | Status: AC

## 2024-04-24 MED ORDER — LANSOPRAZOLE 15 MG PO CPDR: 15.0000 mg | DELAYED_RELEASE_CAPSULE | Freq: Every day | ORAL | 3 refills | Status: AC

## 2024-04-24 MED ORDER — VALSARTAN 160 MG PO TABS: 160.0000 mg | ORAL_TABLET | Freq: Every day | ORAL | 3 refills | Status: AC

## 2024-04-24 NOTE — Patient Instructions (Signed)
 Labs today.  Medications as prescribed.  Follow up in 6 months.   Take care  Dr. Bluford

## 2024-04-25 ENCOUNTER — Ambulatory Visit: Payer: Self-pay | Admitting: Family Medicine

## 2024-04-25 DIAGNOSIS — E1169 Type 2 diabetes mellitus with other specified complication: Secondary | ICD-10-CM | POA: Insufficient documentation

## 2024-04-25 LAB — MICROALBUMIN / CREATININE URINE RATIO
Creatinine, Urine: 142 mg/dL
Microalb/Creat Ratio: 4 mg/g{creat} (ref 0–29)
Microalbumin, Urine: 5.2 ug/mL

## 2024-04-25 LAB — LIPID PANEL
Chol/HDL Ratio: 3.4 ratio (ref 0.0–5.0)
Cholesterol, Total: 155 mg/dL (ref 100–199)
HDL: 45 mg/dL (ref >39)
LDL Chol Calc (NIH): 66 mg/dL (ref 0–99)
Triglycerides: 275 mg/dL — ABNORMAL HIGH (ref 0–149)
VLDL Cholesterol Cal: 44 mg/dL — ABNORMAL HIGH (ref 5–40)

## 2024-04-25 LAB — CBC
Hematocrit: 46.6 % (ref 37.5–51.0)
Hemoglobin: 15.6 g/dL (ref 13.0–17.7)
MCH: 31.8 pg (ref 26.6–33.0)
MCHC: 33.5 g/dL (ref 31.5–35.7)
MCV: 95 fL (ref 79–97)
Platelets: 224 x10E3/uL (ref 150–450)
RBC: 4.91 x10E6/uL (ref 4.14–5.80)
RDW: 13.9 % (ref 11.6–15.4)
WBC: 6.8 x10E3/uL (ref 3.4–10.8)

## 2024-04-25 LAB — CMP14+EGFR
ALT: 13 IU/L (ref 0–44)
AST: 13 IU/L (ref 0–40)
Albumin: 4.4 g/dL (ref 3.8–4.8)
Alkaline Phosphatase: 46 IU/L (ref 44–121)
BUN/Creatinine Ratio: 20 (ref 10–24)
BUN: 20 mg/dL (ref 8–27)
Bilirubin Total: 0.4 mg/dL (ref 0.0–1.2)
CO2: 22 mmol/L (ref 20–29)
Calcium: 9.6 mg/dL (ref 8.6–10.2)
Chloride: 102 mmol/L (ref 96–106)
Creatinine, Ser: 1.01 mg/dL (ref 0.76–1.27)
Globulin, Total: 2.5 g/dL (ref 1.5–4.5)
Glucose: 85 mg/dL (ref 70–99)
Potassium: 4.8 mmol/L (ref 3.5–5.2)
Sodium: 137 mmol/L (ref 134–144)
Total Protein: 6.9 g/dL (ref 6.0–8.5)
eGFR: 80 mL/min/1.73 (ref >59)

## 2024-04-25 LAB — HEMOGLOBIN A1C
Est. average glucose Bld gHb Est-mCnc: 111 mg/dL
Hgb A1c MFr Bld: 5.5 % (ref 4.8–5.6)

## 2024-04-25 MED ORDER — METFORMIN HCL 500 MG PO TABS: 500.0000 mg | ORAL_TABLET | Freq: Two times a day (BID) | ORAL | Status: AC

## 2024-04-25 NOTE — Assessment & Plan Note (Signed)
 Experiencing cough from lisinopril.  Stopping lisinopril.  Starting valsartan .

## 2024-04-25 NOTE — Progress Notes (Signed)
 Subjective:  Patient ID: Alexander Shepherd, male    DOB: 11-03-1950  Age: 73 y.o. MRN: [id number]  CC:   Chief Complaint  Patient presents with   Establish Care    History of sleep apnea but does not wear a machine    HPI:  73 year old male presents to establish care.  BP mildly elevated here today.  Patient states that he is having cough from the lisinopril.  He would like to discuss change in therapy.  Unsure the status of his lipids.  Needs lipid panel.  He is compliant with Crestor .  Reportedly, patient has a history of type 2 diabetes.  He is on metformin  500 mg twice daily.  Needs A1c.  GERD stable on lansoprazole .  Patient needs medication refills today.  Patient Active Problem List   Diagnosis Date Noted   Hyperlipidemia associated with type 2 diabetes mellitus (HCC) 04/25/2024   Abdominal aortic atherosclerosis (HCC) 05/31/2023   Type 2 diabetes mellitus without complication (HCC) 05/31/2023   History of diverticulitis 03/11/2021   Family history of colon cancer 03/11/2021   Low vitamin D level 10/28/2020   Overweight with body mass index (BMI) 25.0-29.9 03/28/2018   Essential hypertension 01/15/2016   Chronic back pain 11/28/2015   Gastroesophageal reflux disease 11/28/2015   Migraine 11/28/2015   Sleep apnea 11/28/2015    Social Hx   Social History   Socioeconomic History   Marital status: Widowed    Spouse name: Not on file   Number of children: Not on file   Years of education: Not on file   Highest education level: 12th grade  Occupational History   Not on file  Tobacco Use   Smoking status: Never   Smokeless tobacco: Never  Vaping Use   Vaping status: Never Used  Substance and Sexual Activity   Alcohol use: Not Currently    Comment: rarely a beer   Drug use: Never   Sexual activity: Not Currently    Birth control/protection: None  Other Topics Concern   Not on file  Social History Narrative   Not on file   Social Drivers of Health    Financial Resource Strain: Low Risk  (04/20/2024)   Overall Financial Resource Strain (CARDIA)    Difficulty of Paying Living Expenses: Not hard at all  Food Insecurity: No Food Insecurity (04/20/2024)   Hunger Vital Sign    Worried About Running Out of Food in the Last Year: Never true    Ran Out of Food in the Last Year: Never true  Transportation Needs: No Transportation Needs (04/20/2024)   PRAPARE - Administrator, Civil Service (Medical): No    Lack of Transportation (Non-Medical): No  Physical Activity: Sufficiently Active (04/20/2024)   Exercise Vital Sign    Days of Exercise per Week: 3 days    Minutes of Exercise per Session: 90 min  Stress: No Stress Concern Present (04/20/2024)   [employer] of Occupational Health - Occupational Stress Questionnaire    Feeling of Stress: Only a little  Social Connections: Socially Isolated (04/20/2024)   Social Connection and Isolation Panel    Frequency of Communication with Friends and Family: More than three times a week    Frequency of Social Gatherings with Friends and Family: Once a week    Attends Religious Services: Patient declined    Database administrator or Organizations: No    Attends Engineer, structural: Not on file    Marital Status: Widowed  Review of Systems Per HPI  Objective:  BP (!) 150/88   Pulse 87   Ht 5' 11 (1.803 m)   Wt 210 lb (95.3 kg)   SpO2 99%   BMI 29.29 kg/m      04/24/2024    3:39 PM 04/24/2024    3:09 PM 04/24/2024    3:05 PM  BP/Weight  Systolic BP 150 147 151  Diastolic BP 88 87 96  Wt. (Lbs)   210  BMI   29.29 kg/m2    Physical Exam Vitals and nursing note reviewed.  Constitutional:      Appearance: Normal appearance.  HENT:     Head: Normocephalic and atraumatic.  Cardiovascular:     Rate and Rhythm: Normal rate and regular rhythm.  Pulmonary:     Effort: Pulmonary effort is normal.     Breath sounds: Normal breath sounds. No wheezing or rales.   Neurological:     Mental Status: He is alert.  Psychiatric:        Mood and Affect: Mood normal.        Behavior: Behavior normal.     Lab Results  Component Value Date   WBC 6.8 04/24/2024   HGB 15.6 04/24/2024   HCT 46.6 04/24/2024   PLT 224 04/24/2024   GLUCOSE 85 04/24/2024   CHOL 155 04/24/2024   TRIG 275 (H) 04/24/2024   HDL 45 04/24/2024   LDLCALC 66 04/24/2024   ALT 13 04/24/2024   AST 13 04/24/2024   NA 137 04/24/2024   K 4.8 04/24/2024   CL 102 04/24/2024   CREATININE 1.01 04/24/2024   BUN 20 04/24/2024   CO2 22 04/24/2024   HGBA1C 5.5 04/24/2024     Assessment & Plan:  Essential hypertension Assessment & Plan: Experiencing cough from lisinopril.  Stopping lisinopril.  Starting valsartan .  Orders: -     Valsartan ; Take 1 tablet (160 mg total) by mouth daily.  Dispense: 90 tablet; Refill: 3  Type 2 diabetes mellitus without complication, without long-term current use of insulin (HCC) Assessment & Plan: A1c returned at 5.5.  Will discuss continued use of metformin  versus discontinuation with the patient.  Orders: -     CMP14+EGFR -     Hemoglobin A1c -     Microalbumin / creatinine urine ratio -     metFORMIN  HCl; Take 1 tablet (500 mg total) by mouth 2 (two) times daily with a meal.  Hyperlipidemia associated with type 2 diabetes mellitus (HCC) Assessment & Plan: LDL at goal.  Continue Crestor .  Orders: -     Rosuvastatin  Calcium ; Take 1 tablet (5 mg total) by mouth daily.  Dispense: 90 tablet; Refill: 3 -     Lipid panel  Screening for deficiency anemia -     CBC  Gastroesophageal reflux disease without esophagitis -     Lansoprazole ; Take 1 capsule (15 mg total) by mouth daily with supper.  Dispense: 90 capsule; Refill: 3    Follow-up:  6 months  Sonnia Strong Bluford DO [facility]

## 2024-04-25 NOTE — Assessment & Plan Note (Signed)
 A1c returned at 5.5.  Will discuss continued use of metformin  versus discontinuation with the patient.

## 2024-04-25 NOTE — Assessment & Plan Note (Signed)
LDL at goal.  Continue Crestor. 

## 2024-06-30 ENCOUNTER — Ambulatory Visit

## 2024-07-27 ENCOUNTER — Telehealth: Payer: Self-pay

## 2024-07-27 DIAGNOSIS — E119 Type 2 diabetes mellitus without complications: Secondary | ICD-10-CM

## 2024-07-27 NOTE — Progress Notes (Signed)
 Pharmacy Quality Measure Review  This patient is appearing on a report for being at risk of failing the adherence measure for diabetes medications this calendar year.   Medication: Metformin  ER 500mg  Last fill date: 06/26 for 90 day supply  Discontinued by provider in September due to improved blood glucose. Removed from med list.  Jon Piety, PharmD [facility] Pharmacist

## 2024-09-29 ENCOUNTER — Ambulatory Visit

## 2024-10-23 ENCOUNTER — Ambulatory Visit: Admitting: Family Medicine
